# Patient Record
Sex: Female | Born: 1975 | Race: White | Hispanic: No | Marital: Single | Smoking: Former smoker
Health system: Southern US, Community
[De-identification: ages and names within clinical notes are randomized; demographics above are authoritative.]

## PROBLEM LIST (undated history)

## (undated) DIAGNOSIS — A6000 Herpesviral infection of urogenital system, unspecified: Secondary | ICD-10-CM

## (undated) DIAGNOSIS — K589 Irritable bowel syndrome without diarrhea: Secondary | ICD-10-CM

## (undated) DIAGNOSIS — B360 Pityriasis versicolor: Secondary | ICD-10-CM

## (undated) HISTORY — DX: Herpesviral infection of urogenital system, unspecified: A60.00

## (undated) HISTORY — DX: Irritable bowel syndrome, unspecified: K58.9

## (undated) HISTORY — DX: Pityriasis versicolor: B36.0

## (undated) HISTORY — PX: WISDOM TOOTH EXTRACTION: SHX21

---

## 2017-10-14 ENCOUNTER — Other Ambulatory Visit: Payer: Self-pay | Admitting: Certified Nurse Midwife

## 2018-08-23 ENCOUNTER — Telehealth: Payer: Self-pay

## 2018-08-23 NOTE — Telephone Encounter (Signed)
Pt called after hour nurse for refills of valtrex which was given by PH.

## 2018-09-28 ENCOUNTER — Other Ambulatory Visit: Payer: Self-pay | Admitting: Certified Nurse Midwife

## 2018-09-28 ENCOUNTER — Telehealth: Payer: Self-pay

## 2018-09-28 MED ORDER — VALACYCLOVIR HCL 1 G PO TABS: 500.0000 mg | ORAL_TABLET | Freq: Every day | ORAL | 0 refills | Status: DC

## 2018-09-28 NOTE — Telephone Encounter (Signed)
Pt has appt scheduled for 10/29/2018.  Needs refill of to get her to appt.  [phone]  Called pt to verify what medication and what pharm.  Pt states valtrex 1000mg  (for rx to last longer than rx of 500mg  would)  She takes half a pill at a time.

## 2018-09-28 NOTE — Telephone Encounter (Signed)
Done. Karen Wheeler, CNM

## 2018-09-29 NOTE — Telephone Encounter (Signed)
Pt calling to see if refill was sent in.  [phone] Adv pt eRx'd

## 2018-10-29 ENCOUNTER — Ambulatory Visit (INDEPENDENT_AMBULATORY_CARE_PROVIDER_SITE_OTHER): Payer: 59 | Admitting: Certified Nurse Midwife

## 2018-10-29 ENCOUNTER — Encounter: Payer: Self-pay | Admitting: Certified Nurse Midwife

## 2018-10-29 ENCOUNTER — Other Ambulatory Visit (HOSPITAL_COMMUNITY)
Admission: RE | Admit: 2018-10-29 | Discharge: 2018-10-29 | Disposition: A | Discharge status: Home / Self Care | Payer: 59 | Source: Ambulatory Visit | Attending: Certified Nurse Midwife | Admitting: Certified Nurse Midwife

## 2018-10-29 VITALS — BP 104/70 | HR 87 | Ht 66.0 in | Wt 134.0 lb

## 2018-10-29 DIAGNOSIS — Z13220 Encounter for screening for lipoid disorders: Secondary | ICD-10-CM

## 2018-10-29 DIAGNOSIS — Z01419 Encounter for gynecological examination (general) (routine) without abnormal findings: Secondary | ICD-10-CM

## 2018-10-29 DIAGNOSIS — Z131 Encounter for screening for diabetes mellitus: Secondary | ICD-10-CM

## 2018-10-29 DIAGNOSIS — Z23 Encounter for immunization: Secondary | ICD-10-CM

## 2018-10-29 DIAGNOSIS — Z124 Encounter for screening for malignant neoplasm of cervix: Secondary | ICD-10-CM | POA: Insufficient documentation

## 2018-10-29 DIAGNOSIS — A6000 Herpesviral infection of urogenital system, unspecified: Secondary | ICD-10-CM | POA: Insufficient documentation

## 2018-10-29 DIAGNOSIS — Z1239 Encounter for other screening for malignant neoplasm of breast: Secondary | ICD-10-CM

## 2018-10-29 MED ORDER — TRIAMCINOLONE ACETONIDE 0.1 % EX OINT: 1.0000 "application " | TOPICAL_OINTMENT | Freq: Two times a day (BID) | CUTANEOUS | 1 refills | Status: DC | PRN

## 2018-10-29 MED ORDER — VALACYCLOVIR HCL 1 G PO TABS: 500.0000 mg | ORAL_TABLET | Freq: Every day | ORAL | 11 refills | Status: DC

## 2018-10-29 NOTE — Progress Notes (Signed)
Gynecology Annual Exam  PCP: Patient, No Pcp Per  Chief Complaint:  Chief Complaint  Patient presents with  . Gynecologic Exam    History of Present Illness:Jaxon Papalia is a 43 year old Caucasian/White female, G1 P0010, who presents for her annual exam. She is having no significant GYN problems.  Her menses are regular . They occur every 1 month , they last 3-5days , are medium flow with one heavier day requiring pad change q2-3 hours and are without clots. Her LMP was 10/16/2018 She has had no intermenstrual spotting.   She reports dysmenorrhea with some menses. She uses ibuprofen with relief.  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 She is requesting a refill.  Since her last annual GYN exam dated 09/18/2016 , she has had no significant changes in her health history.  She is sexually active. She is currently using condoms for contraception.  Her most recent pap smear was obtained 09/18/2016 and was NIL/ neg HRHPV. A 2016 Pap was LGSIL with negative HRHPV. Mammogram is not applicable.  There is no family history of breast cancer.  There is no family history of ovarian cancer.  The patient does do monthly self breast exams.  The patient quit smoking 4-5 months ago.  The patient does drink about 4/ month The patient does not use illegal drugs.  The patient exercises regularly.  The patient does get adequate calcium in her diet.  She has not had a recent lipid panel and is interested.  Review of Systems: Review of Systems  Constitutional: Negative for chills, fever and weight loss.  HENT: Negative for congestion, sinus pain and sore throat.   Eyes: Negative for blurred vision and pain.  Respiratory: Negative for hemoptysis, shortness of breath and wheezing.   Cardiovascular: Negative for chest pain, palpitations and leg swelling.  Gastrointestinal: Negative for abdominal pain,  blood in stool, diarrhea, heartburn, nausea and vomiting.  Genitourinary: Negative for dysuria, frequency, hematuria and urgency.  Musculoskeletal: Negative for back pain, joint pain and myalgias.  Skin: Negative for itching and rash.  Neurological: Negative for dizziness, tingling and headaches.  Endo/Heme/Allergies: Negative for environmental allergies and polydipsia. Does not bruise/bleed easily.       Negative for hirsutism   Psychiatric/Behavioral: Negative for depression. The patient is not nervous/anxious and does not have insomnia.     Past Medical History:  Past Medical History:  Diagnosis Date  . Herpes genitalis   . Irritable bowel syndrome (IBS)   . Tinea versicolor     Past Surgical History:  Past Surgical History:  Procedure Laterality Date  . WISDOM TOOTH EXTRACTION      Family History:  Family History  Problem Relation Age of Onset  . Hypertension Father   . Other Sister        cardiac anomaly, congenital  . Stroke Sister 79       had valve replacement  . Kidney cancer Maternal Grandmother        family history  . Diabetes Other        type 2    Social History:  Social History   Socioeconomic History  . Marital status: Single    Spouse name: Not on file  . Number of children: 0  . Years of education: Not on file  . Highest education level: Not on file  Occupational History  . Occupation: Scientific laboratory technician  Social Needs  . Financial resource strain: Not on file  . Food insecurity:    Worry: Not on file    Inability: Not on file  . Transportation needs:    Medical: Not on file    Non-medical: Not on file  Tobacco Use  . Smoking status: Former Smoker    Packs/day: 0.50    Types: Cigarettes  . Smokeless tobacco: Former Neurosurgeon    Quit date: 06/14/2018  Substance and Sexual Activity  . Alcohol use: Yes    Comment: 4/ month  . Drug use: Never  . Sexual activity: Yes    Partners: Male    Birth control/protection: Condom  Lifestyle  .  Physical activity:    Days per week: Not on file    Minutes per session: Not on file  . Stress: Not on file  Relationships  . Social connections:    Talks on phone: Not on file    Gets together: Not on file    Attends religious service: Not on file    Active member of club or organization: Not on file    Attends meetings of clubs or organizations: Not on file    Relationship status: Not on file  . Intimate partner violence:    Fear of current or ex partner: Not on file    Emotionally abused: Not on file    Physically abused: Not on file    Forced sexual activity: Not on file  Other Topics Concern  . Not on file  Social History Narrative  . Not on file    Allergies:  No Known Allergies  Medications: Prior to Admission medications   Medication Sig Start Date End Date Taking? Authorizing Provider  diphenhydrAMINE (BENADRYL) 25 MG tablet Take by mouth.   Yes [provider]  loratadine (CLARITIN) 10 MG tablet Take by mouth.   Yes [provider]  valACYclovir (VALTREX) 1000 MG tablet Take 0.5 tablets (500 mg total) by mouth daily. 09/28/18  Yes Farrel Conners, CNM    Physical Exam Vitals: Blood pressure 104/70, pulse 87, height 5\' 6"  (1.676 m), weight 134 lb (60.8 kg), last menstrual period 10/16/2018.  General: WF In NAD HEENT: normocephalic, anicteric Neck: no thyroid enlargement, no palpable nodules, no cervical lymphadenopathy  Pulmonary: No increased work of breathing, CTAB Cardiovascular: RRR, without murmur  Breast: Breast symmetrical, no tenderness, no palpable nodules or masses, no skin or nipple retraction present, no nipple discharge.  No axillary, infraclavicular or supraclavicular lymphadenopathy. Abdomen: Soft, non-tender, non-distended.  Umbilicus without lesions.  No hepatomegaly or masses palpable. No evidence of hernia. Genitourinary:  External: Normal external female genitalia.  Normal urethral meatus, normal  Bartholin's and Skene's  glands.    Vagina: Normal vaginal mucosa, no evidence of prolapse.    Cervix: Grossly normal in appearance, no bleeding, non-tender  Uterus: Anteverted, normal size, shape, and consistency, mobile, and non-tender  Adnexa: No adnexal masses, non-tender  Rectal: deferred  Lymphatic: no evidence of inguinal lymphadenopathy Extremities: no edema, erythema, or tenderness Neurologic: Grossly intact Psychiatric: mood appropriate, affect full     Assessment: 43 y.o. G1P0010 with normal gyn exam Herpes genitalis   Plan:   1) Breast cancer screening - recommend monthly self breast exam and annual screening mammograms. Mammogram was ordered today. Patient to schedule at [facility]  2) STI screening was offered and declined.  3) Cervical cancer screening - Pap was done. ASCCP guidelines and rational discussed.  Patient opts for every 1-2 years screening interval  4) Contraception - condoms.   5) Routine healthcare maintenance including cholesterol and diabetes screening ordered today  Last TDAP was 11/22/2008. Given TDAP today.   6)Refill Valtrex 10000 mgm-take 1/2 tablet daily. Also RX for Kenalog ointment given for prn use for itching.   7)RTO 1 year and prn.  Farrel Connersolleen Deyonna Fitzsimmons, CNM

## 2018-10-30 ENCOUNTER — Encounter: Payer: Self-pay | Admitting: Certified Nurse Midwife

## 2018-10-30 LAB — LIPID PANEL WITH LDL/HDL RATIO
Cholesterol, Total: 183 mg/dL (ref 100–199)
HDL: 74 mg/dL (ref >39)
LDL Calculated: 92 mg/dL (ref 0–99)
LDl/HDL Ratio: 1.2 ratio (ref 0.0–3.2)
TRIGLYCERIDES: 85 mg/dL (ref 0–149)
VLDL CHOLESTEROL CAL: 17 mg/dL (ref 5–40)

## 2018-10-30 LAB — HEMOGLOBIN A1C
Est. average glucose Bld gHb Est-mCnc: 105 mg/dL
Hgb A1c MFr Bld: 5.3 % (ref 4.8–5.6)

## 2018-11-04 LAB — CYTOLOGY - PAP: HPV: DETECTED — AB

## 2018-11-05 ENCOUNTER — Encounter: Payer: Self-pay | Admitting: Certified Nurse Midwife

## 2018-11-05 DIAGNOSIS — R87612 Low grade squamous intraepithelial lesion on cytologic smear of cervix (LGSIL): Secondary | ICD-10-CM | POA: Insufficient documentation

## 2018-11-22 ENCOUNTER — Ambulatory Visit: Payer: 59 | Admitting: Obstetrics and Gynecology

## 2018-11-22 ENCOUNTER — Encounter: Payer: Self-pay | Admitting: Obstetrics and Gynecology

## 2018-11-22 ENCOUNTER — Other Ambulatory Visit (HOSPITAL_COMMUNITY)
Admission: RE | Admit: 2018-11-22 | Discharge: 2018-11-22 | Disposition: A | Discharge status: Home / Self Care | Payer: 59 | Source: Ambulatory Visit | Attending: Obstetrics and Gynecology | Admitting: Obstetrics and Gynecology

## 2018-11-22 VITALS — HR 81 | Ht 65.5 in | Wt 137.0 lb

## 2018-11-22 DIAGNOSIS — R87612 Low grade squamous intraepithelial lesion on cytologic smear of cervix (LGSIL): Secondary | ICD-10-CM | POA: Insufficient documentation

## 2018-11-22 NOTE — Patient Instructions (Signed)
COLPOSCOPY POST-PROCEDURE INSTRUCTIONS  1. You may take Ibuprofen, Aleve or Tylenol for cramping if needed.  2. If Monsel's solution was used, you will have a black discharge.  3. Light bleeding is normal.  If bleeding is heavier than your period, please call.  4. Put nothing in your vagina until the bleeding or discharge stops (usually 2 or 3 days).  5. We will call you within one week with biopsy results or discuss the results at your follow-up appointment if needed.  

## 2018-11-22 NOTE — Progress Notes (Signed)
   GYNECOLOGY CLINIC COLPOSCOPY PROCEDURE NOTE  43 y.o. G1P0010 here for colposcopy for low-grade squamous intraepithelial neoplasia (LGSIL - encompassing HPV,mild dysplasia,CIN I)  pap smear on 10/29/2018. Discussed underlying role for HPV infection in the development of cervical dysplasia, its natural history and progression/regression, need for surveillance.  Is the patient  pregnant: No LMP: Patient's last menstrual period was 11/12/2018 (exact date). Smoking status:  reports that she has quit smoking. Her smoking use included cigarettes. She smoked 0.50 packs per day. She quit smokeless tobacco use about 5 months ago.  Patient given informed consent, signed copy in the chart, time out was performed.  The patient was position in dorsal lithotomy position. Speculum was placed the cervix was visualized.   After application of acetic acid colposcopic inspection of the cervix was undertaken.   Colposcopy adequate, full visualization of transformation zone: No punctation noted at 11 o'clock; corresponding biopsies obtained.   ECC specimen obtained:  Yes  All specimens were labeled and sent to pathology.   Patient was given post procedure instructions.  Will follow up pathology and manage accordingly.  Routine preventative health maintenance measures emphasized.  Physical Exam Genitourinary:        Adelene Idler MD [facility], [facility] 11/22/2018 5:40 PM

## 2018-11-29 NOTE — Progress Notes (Signed)
Called and discussed with patient. Repeat pap smear in 1 year.

## 2019-11-12 ENCOUNTER — Other Ambulatory Visit: Payer: Self-pay | Admitting: Certified Nurse Midwife

## 2020-01-17 ENCOUNTER — Other Ambulatory Visit: Payer: Self-pay | Admitting: Certified Nurse Midwife

## 2020-03-07 ENCOUNTER — Other Ambulatory Visit: Payer: Self-pay

## 2020-03-07 ENCOUNTER — Other Ambulatory Visit (HOSPITAL_COMMUNITY)
Admission: RE | Admit: 2020-03-07 | Discharge: 2020-03-07 | Disposition: A | Discharge status: Home / Self Care | Payer: 59 | Source: Ambulatory Visit | Attending: Certified Nurse Midwife | Admitting: Certified Nurse Midwife

## 2020-03-07 ENCOUNTER — Encounter: Payer: Self-pay | Admitting: Certified Nurse Midwife

## 2020-03-07 ENCOUNTER — Ambulatory Visit (INDEPENDENT_AMBULATORY_CARE_PROVIDER_SITE_OTHER): Payer: 59 | Admitting: Certified Nurse Midwife

## 2020-03-07 VITALS — BP 120/70 | HR 99 | Resp 16 | Ht 65.0 in | Wt 140.2 lb

## 2020-03-07 DIAGNOSIS — N870 Mild cervical dysplasia: Secondary | ICD-10-CM | POA: Diagnosis not present

## 2020-03-07 DIAGNOSIS — R87612 Low grade squamous intraepithelial lesion on cytologic smear of cervix (LGSIL): Secondary | ICD-10-CM

## 2020-03-07 DIAGNOSIS — Z01419 Encounter for gynecological examination (general) (routine) without abnormal findings: Secondary | ICD-10-CM | POA: Diagnosis not present

## 2020-03-07 DIAGNOSIS — Z1231 Encounter for screening mammogram for malignant neoplasm of breast: Secondary | ICD-10-CM | POA: Diagnosis not present

## 2020-03-07 MED ORDER — VALACYCLOVIR HCL 1 G PO TABS: ORAL_TABLET | ORAL | 3 refills | Status: DC

## 2020-03-07 NOTE — Progress Notes (Signed)
Gynecology Annual Exam  PCP: Patient, No Pcp Per  Chief Complaint:  Chief Complaint  Patient presents with  . Gynecologic Exam    History of Present Illness:Karen Wheeler is a 44 year old Caucasian/White female, G1 P0010, who presents for her annual exam. She is having no significant GYN problems.  Her menses are regular . They occur every 1 month , they last 3 days , are medium flow with one heavier day requiring pad change q2-3 hours and are without clots. Her LMP was 03/01/2020.  She has had no intermenstrual spotting.   She reports dysmenorrhea on the first day of her menses.  She uses ibuprofen with relief.  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 She is requesting a refill.  Since her last annual GYN exam dated 10/29/2018 , she has had no significant changes in her health history. She did not get the Covid vaccine. Her MGM died last 2023-08-01 from COPD/ ? Lung cancer. She is taking care of her step MGF who has Parkinson's She is sexually active. She is currently using condoms for contraception.  Her most recent pap smear was obtained 10/29/2018 and was LGSIL/ positive HRHPV. 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 Has not had a mammogram yet.  There is no family history of breast cancer.  There is no family history of ovarian cancer.  The patient does do monthly self breast exams.  The patient quit smoking in 2019.  The patient does drink occasionally The patient does not use illegal drugs.  The patient exercises by walking 4 miles twice a week.  The patient does get adequate calcium in her diet.  She had a lipid panel done 2020 which was normal.   Review of Systems: Review of Systems  Constitutional: Negative for chills, fever and weight loss.  HENT: Negative for congestion, sinus pain and sore throat.   Eyes: Negative for blurred vision and pain.    Respiratory: Negative for hemoptysis, shortness of breath and wheezing.   Cardiovascular: Negative for chest pain, palpitations and leg swelling.  Gastrointestinal: Negative for abdominal pain, blood in stool, diarrhea, heartburn, nausea and vomiting.  Genitourinary: Negative for dysuria, frequency, hematuria and urgency.  Musculoskeletal: Negative for back pain, joint pain and myalgias.  Skin: Negative for itching and rash.  Neurological: Negative for dizziness, tingling and headaches.  Endo/Heme/Allergies: Positive for environmental allergies. Negative for polydipsia. Does not bruise/bleed easily.       Negative for hirsutism   Psychiatric/Behavioral: Negative for depression. The patient is nervous/anxious. The patient does not have insomnia.     Past Medical History:  Past Medical History:  Diagnosis Date  . Herpes genitalis   . Irritable bowel syndrome (IBS)   . Tinea versicolor     Past Surgical History:  Past Surgical History:  Procedure Laterality Date  . WISDOM TOOTH EXTRACTION      Family History:  Family History  Problem Relation Age of Onset  . Hypertension Father   . Other Sister        cardiac anomaly, congenital  . Stroke Sister 60       had valve replacement  . Kidney cancer Maternal Grandmother        ?lung cancer  . COPD Maternal Grandmother   . Diabetes Other        type 2    Social History:  Social History   Socioeconomic History  . Marital status: Single    Spouse name: Not on file  . Number of children: 0  . Years of education: Not on file  . Highest education level: Not on file  Occupational History  . Occupation: Scientific laboratory technician  Tobacco Use  . Smoking status: Former Smoker    Packs/day: 0.50    Types: Cigarettes  . Smokeless tobacco: Former Neurosurgeon    Quit date: 06/14/2018  Vaping Use  . Vaping Use: Never used  Substance and Sexual Activity  . Alcohol use: Yes    Comment: 4/ month  . Drug use: Never  . Sexual activity: Yes     Partners: Male    Birth control/protection: Condom  Other Topics Concern  . Not on file  Social History Narrative  . Not on file   Social Determinants of Health   Financial Resource Strain:   . Difficulty of Paying Living Expenses:   Food Insecurity:   . Worried About Programme researcher, broadcasting/film/video in the Last Year:   . Barista in the Last Year:   Transportation Needs:   . Freight forwarder (Medical):   Marland Kitchen Lack of Transportation (Non-Medical):   Physical Activity:   . Days of Exercise per Week:   . Minutes of Exercise per Session:   Stress:   . Feeling of Stress :   Social Connections:   . Frequency of Communication with Friends and Family:   . Frequency of Social Gatherings with Friends and Family:   . Attends Religious Services:   . Active Member of Clubs or Organizations:   . Attends Banker Meetings:   Marland Kitchen Marital Status:   Intimate Partner Violence:   . Fear of Current or Ex-Partner:   . Emotionally Abused:   Marland Kitchen Physically Abused:   . Sexually Abused:     Allergies:  No Known Allergies  Medications:  Current Outpatient Medications:  .  diphenhydrAMINE (BENADRYL) 25 MG tablet, Take by mouth., Disp: , Rfl:  .  fexofenadine (ALLEGRA) 180 MG tablet, Take 180 mg by mouth daily., Disp: , Rfl:  .  triamcinolone ointment (KENALOG) 0.1 %, Apply 1 application topically 2 (two) times daily as needed., Disp: 30 g, Rfl: 1 .  valACYclovir (VALTREX) 1000 MG tablet, TAKE ONE HALF (1/2) TABLET BY MOUTH DAILY, Disp: 45 tablet, Rfl: 3   :Physical Exam Vitals: BP 120/70   Pulse 99   Resp 16   Ht 5\' 5"  (1.651 m)   Wt 140 lb 3.2 oz (63.6 kg)   LMP 03/01/2020   SpO2 99%   BMI 23.33 kg/m   General: WF In NAD HEENT: normocephalic, anicteric Neck: no thyroid enlargement, no palpable nodules, no cervical lymphadenopathy  Pulmonary: No increased work of breathing, CTAB Cardiovascular: RRR, without murmur  Breast: Breast symmetrical, no tenderness, no palpable  nodules or masses, no skin or nipple retraction present, no nipple discharge.  No axillary, infraclavicular or supraclavicular lymphadenopathy. Abdomen: Soft, non-tender, non-distended.  Umbilicus without lesions.  No hepatomegaly or masses palpable. No evidence of hernia. Genitourinary:  External: Normal external female genitalia.  Normal urethral meatus, normal Bartholin's and Skene's glands.    Vagina: Normal vaginal mucosa, no evidence of prolapse.    Cervix: Grossly normal in appearance, no bleeding, non-tender  Uterus: Anteverted, normal size, shape, and consistency, mobile, and non-tender  Adnexa: No adnexal masses, non-tender  Rectal: deferred  Lymphatic: no evidence of inguinal lymphadenopathy Extremities: no edema, erythema,  or tenderness Neurologic: Grossly intact Psychiatric: mood appropriate, affect full     Assessment: 44 y.o. 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 Mammogram was ordered today. Patient to schedule at [facility]  2) Cervical cancer screening/ CIN1 last year - Pap was done.  3) Contraception - condoms.   4) Routine healthcare maintenance including cholesterol and diabetes screening UTD .   5)Refill Valtrex 10000 mgm-take 1/2 tablet daily.   6)RTO 1 year and prn.  Farrel Conners, CNM

## 2020-03-12 LAB — CYTOLOGY - PAP
Comment: NEGATIVE
Diagnosis: UNDETERMINED — AB
High risk HPV: POSITIVE — AB

## 2020-04-05 ENCOUNTER — Telehealth: Payer: Self-pay | Admitting: Certified Nurse Midwife

## 2020-04-05 NOTE — Telephone Encounter (Signed)
Called with pap smear results: ASCUS with positive HRHPV. Previous colposcopy for LGSIL PAP last year. Colpo biopsy showed CIN1. Recommend another colposcopy per ASCCP guidelines. Scheduled for colposcopy with Dr Jerene Pitch. Farrel Conners, CNM

## 2020-05-02 ENCOUNTER — Ambulatory Visit: Payer: 59 | Admitting: Obstetrics and Gynecology

## 2020-05-29 ENCOUNTER — Encounter: Payer: Self-pay | Admitting: Obstetrics and Gynecology

## 2020-05-29 ENCOUNTER — Other Ambulatory Visit: Payer: Self-pay

## 2020-05-29 ENCOUNTER — Ambulatory Visit (INDEPENDENT_AMBULATORY_CARE_PROVIDER_SITE_OTHER): Payer: 59 | Admitting: Obstetrics and Gynecology

## 2020-05-29 ENCOUNTER — Other Ambulatory Visit (HOSPITAL_COMMUNITY)
Admission: RE | Admit: 2020-05-29 | Discharge: 2020-05-29 | Disposition: A | Discharge status: Home / Self Care | Payer: 59 | Source: Ambulatory Visit | Attending: Obstetrics and Gynecology | Admitting: Obstetrics and Gynecology

## 2020-05-29 VITALS — BP 100/70 | HR 71 | Resp 18 | Ht 65.0 in | Wt 142.0 lb

## 2020-05-29 DIAGNOSIS — R87610 Atypical squamous cells of undetermined significance on cytologic smear of cervix (ASC-US): Secondary | ICD-10-CM | POA: Diagnosis present

## 2020-05-29 NOTE — Progress Notes (Signed)
   GYNECOLOGY CLINIC COLPOSCOPY PROCEDURE NOTE  44 y.o. G1P0010 here for colposcopy for ASCUS with NEGATIVE high risk HPV  pap smear on 03/07/2020. Hx of CIN on colposcopy in 2021.  Discussed underlying role for HPV infection in the development of cervical dysplasia, its natural history and progression/regression, need for surveillance.  Is the patient  pregnant: No LMP: Patient's last menstrual period was 05/21/2020. Smoking status:  reports that she has quit smoking. Her smoking use included cigarettes. She smoked 0.50 packs per day. She quit smokeless tobacco use about 1 years ago.  Patient given informed consent, signed copy in the chart, time out was performed.  The patient was position in dorsal lithotomy position. Speculum was placed the cervix was visualized.   After application of acetic acid colposcopic inspection of the cervix was undertaken.   Colposcopy adequate, full visualization of transformation zone: Yes no visible lesions; corresponding biopsies obtained.   ECC specimen obtained:  Yes  All specimens were labeled and sent to pathology.   Patient was given post procedure instructions.  Will follow up pathology and manage accordingly.  Routine preventative health maintenance measures emphasized.  Physical Exam Genitourinary:     Exam conducted with a chaperone present.     Adelene Idler MD [facility], [facility] 05/29/2020 3:42 PM

## 2020-05-29 NOTE — Patient Instructions (Signed)
Colposcopy, Care After This sheet gives you information about how to care for yourself after your procedure. Your doctor may also give you more specific instructions. If you have problems or questions, contact your doctor. What can I expect after the procedure? If you did not have a tissue sample removed (did not have a biopsy), you may only have some spotting for a few days. You can go back to your normal activities. If you had a tissue sample removed, it is common to have:  Soreness and pain. This may last for a few days.  Light-headedness.  Mild bleeding from your vagina or dark-colored, grainy discharge from your vagina. This may last for a few days. You may need to wear a sanitary pad.  Spotting for at least 48 hours after the procedure. Follow these instructions at home:   Take over-the-counter and prescription medicines only as told by your doctor. Ask your doctor what medicines you can start taking again. This is very important if you take blood-thinning medicine.  Do not drive or use heavy machinery while taking prescription pain medicine.  For 3 days, or as long as your doctor tells you, avoid: ? Douching. ? Using tampons. ? Having sex.  If you use birth control (contraception), keep using it.  Limit activity for the first day after the procedure. Ask your doctor what activities are safe for you.  It is up to you to get the results of your procedure. Ask your doctor when your results will be ready.  Keep all follow-up visits as told by your doctor. This is important. Contact a doctor if:  You get a skin rash. Get help right away if:  You are bleeding a lot from your vagina. It is a lot of bleeding if you are using more than one pad an hour for 2 hours in a row.  You have clumps of blood (blood clots) coming from your vagina.  You have a fever.  You have chills  You have pain in your lower belly (pelvic area).  You have signs of infection, such as vaginal  discharge that is: ? Different than usual. ? Yellow. ? Bad-smelling.  You have very pain or cramps in your lower belly that do not get better with medicine.  You feel light-headed.  You feel dizzy.  You pass out (faint). Summary  If you did not have a tissue sample removed (did not have a biopsy), you may only have some spotting for a few days. You can go back to your normal activities.  If you had a tissue sample removed, it is common to have mild pain and spotting for 48 hours.  For 3 days, or as long as your doctor tells you, avoid douching, using tampons and having sex.  Get help right away if you have bleeding, very bad pain, or signs of infection. This information is not intended to replace advice given to you by your health care provider. Make sure you discuss any questions you have with your health care provider. Document Revised: 08/14/2017 Document Reviewed: 05/21/2016 Elsevier Patient Education  2020 [employer]  

## 2020-06-01 LAB — SURGICAL PATHOLOGY

## 2020-09-28 ENCOUNTER — Ambulatory Visit
Admission: EM | Admit: 2020-09-28 | Discharge: 2020-09-28 | Disposition: A | Discharge status: Home / Self Care | Payer: 59 | Attending: Family Medicine | Admitting: Family Medicine

## 2020-09-28 ENCOUNTER — Encounter: Payer: Self-pay | Admitting: Family Medicine

## 2020-09-28 ENCOUNTER — Ambulatory Visit (INDEPENDENT_AMBULATORY_CARE_PROVIDER_SITE_OTHER): Payer: 59

## 2020-09-28 DIAGNOSIS — U071 COVID-19: Secondary | ICD-10-CM

## 2020-09-28 DIAGNOSIS — J1282 Pneumonia due to coronavirus disease 2019: Secondary | ICD-10-CM | POA: Diagnosis not present

## 2020-09-28 MED ORDER — AZITHROMYCIN 250 MG PO TABS: 250.0000 mg | ORAL_TABLET | Freq: Every day | ORAL | 0 refills | Status: DC

## 2020-09-28 MED ORDER — ALBUTEROL SULFATE HFA 108 (90 BASE) MCG/ACT IN AERS: 1.0000 | INHALATION_SPRAY | Freq: Four times a day (QID) | RESPIRATORY_TRACT | 0 refills | Status: DC | PRN

## 2020-09-28 MED ORDER — ONDANSETRON 4 MG PO TBDP: 4.0000 mg | ORAL_TABLET | Freq: Three times a day (TID) | ORAL | 0 refills | Status: DC | PRN

## 2020-09-28 MED ORDER — DEXAMETHASONE SODIUM PHOSPHATE 10 MG/ML IJ SOLN: 10.0000 mg | Freq: Once | INTRAMUSCULAR | Status: DC

## 2020-09-28 NOTE — ED Provider Notes (Signed)
Karen Wheeler    CSN: [id number] Arrival date & time: 09/28/20  1001      History   Chief Complaint Chief Complaint  Patient presents with  . Covid Positive    HPI Karen Wheeler is a 45 y.o. female.   Patient is a 45 year old female who presents today for chest tightness, cough.  Tested positive for COVID approximately 1 week ago.  Concern for pneumonia.  Mild shortness of breath. Fever is coming and going. Mild nausea.      Past Medical History:  Diagnosis Date  . Herpes genitalis   . Irritable bowel syndrome (IBS)   . Tinea versicolor     Patient Active Problem List   Diagnosis Date Noted  . LGSIL on Pap smear of cervix 11/05/2018  . Genital herpes 10/29/2018    Past Surgical History:  Procedure Laterality Date  . WISDOM TOOTH EXTRACTION      OB History    Gravida  1   Para      Term      Preterm      AB  1   Living        SAB      IAB      Ectopic      Multiple      Live Births               Home Medications    Prior to Admission medications   Medication Sig Start Date End Date Taking? Authorizing Provider  albuterol (VENTOLIN HFA) 108 (90 Base) MCG/ACT inhaler Inhale 1-2 puffs into the lungs every 6 (six) hours as needed for wheezing or shortness of breath. 09/28/20  Yes Sherece Gambrill A, NP  azithromycin (ZITHROMAX) 250 MG tablet Take 1 tablet (250 mg total) by mouth daily. Take first 2 tablets together, then 1 every day until finished. 09/28/20  Yes Daffney Greenly A, NP  ondansetron (ZOFRAN ODT) 4 MG disintegrating tablet Take 1 tablet (4 mg total) by mouth every 8 (eight) hours as needed for nausea or vomiting. 09/28/20  Yes Brennyn Ortlieb A, NP  diphenhydrAMINE (BENADRYL) 25 MG tablet Take by mouth.    [provider]  fexofenadine (ALLEGRA) 180 MG tablet Take 180 mg by mouth daily.    [provider]  triamcinolone ointment (KENALOG) 0.1 % Apply 1 application topically 2 (two) times daily as needed. 10/29/18    Farrel Conners, CNM  valACYclovir (VALTREX) 1000 MG tablet TAKE ONE HALF (1/2) TABLET BY MOUTH DAILY 03/07/20   Farrel Conners, CNM    Family History Family History  Problem Relation Age of Onset  . Hypertension Father   . Other Sister        cardiac anomaly, congenital  . Stroke Sister 55       had valve replacement  . Kidney cancer Maternal Grandmother        ?lung cancer  . COPD Maternal Grandmother   . Diabetes Other        type 2    Social History Social History   Tobacco Use  . Smoking status: Former Smoker    Packs/day: 0.50    Types: Cigarettes  . Smokeless tobacco: Former Neurosurgeon    Quit date: 06/14/2018  Vaping Use  . Vaping Use: Never used  Substance Use Topics  . Alcohol use: Yes    Comment: 4/ month  . Drug use: Never     Allergies   Patient has no known allergies.   Review of  Systems Review of Systems   Physical Exam Triage Vital Signs ED Triage Vitals  Enc Vitals Group     BP 09/28/20 1045 122/83     Pulse Rate 09/28/20 1045 95     Resp 09/28/20 1045 17     Temp 09/28/20 1045 98.9 F (37.2 C)     Temp Source 09/28/20 1045 Oral     SpO2 09/28/20 1045 96 %     Weight --      Height --      Head Circumference --      Peak Flow --      Pain Score 09/28/20 1054 0     Pain Loc --      Pain Edu? --      Excl. in GC? --    No data found.  Updated Vital Signs BP 122/83 (BP Location: Left Arm)   Pulse 95   Temp 98.9 F (37.2 C) (Oral)   Resp 17   LMP 09/08/2020   SpO2 96%   Visual Acuity Right Eye Distance:   Left Eye Distance:   Bilateral Distance:    Right Eye Near:   Left Eye Near:    Bilateral Near:     Physical Exam Vitals and nursing note reviewed.  Constitutional:      General: She is not in acute distress.    Appearance: Normal appearance. She is not ill-appearing, toxic-appearing or diaphoretic.  HENT:     Head: Normocephalic.     Right Ear: Tympanic membrane and ear canal normal.     Left Ear: Tympanic  membrane and ear canal normal.     Nose: Nose normal.     Mouth/Throat:     Pharynx: Oropharynx is clear.  Eyes:     Conjunctiva/sclera: Conjunctivae normal.  Cardiovascular:     Rate and Rhythm: Normal rate and regular rhythm.  Pulmonary:     Effort: Pulmonary effort is normal.     Breath sounds: Normal breath sounds.  Musculoskeletal:        General: Normal range of motion.     Cervical back: Normal range of motion.  Skin:    General: Skin is warm and dry.     Findings: No rash.  Neurological:     Mental Status: She is alert.  Psychiatric:        Mood and Affect: Mood normal.      UC Treatments / Results  Labs (all labs ordered are listed, but only abnormal results are displayed) Labs Reviewed - No data to display  EKG   Radiology DG Chest 2 View  Result Date: 09/28/2020 CLINICAL DATA:  Chest tightness.  Coronavirus infection. EXAM: CHEST - 2 VIEW COMPARISON:  None. FINDINGS: Heart size is normal. Mediastinal shadows are normal. Question few small areas of patchy infiltrate in the mid and lower lungs, not conclusively related to acute pneumonia. These could be areas of scarring. No visible effusion. No significant bone finding. IMPRESSION: Question small areas of patchy infiltrate in the mid and lower lungs which could be areas of scarring or mild pneumonia. No advanced pulmonary pathology visible by radiography. Electronically Signed   By: Paulina Fusi M.D.   On: 09/28/2020 11:09    Procedures Procedures (including critical care time)  Medications Ordered in UC Medications - No data to display  Initial Impression / Assessment and Plan / UC Course  I have reviewed the triage vital signs and the nursing notes.  Pertinent labs & imaging results that were  available during my care of the patient were reviewed by me and considered in my medical decision making (see chart for details).     Covid pneumonia-this is most likely diagnosis based on symptoms, COVID history  and chest x-ray.  Chest x-ray with question small areas of patchy infiltrate in the mid and lower lungs which could be areas of scarring or mild pneumonia Most likely viral pneumonia but to be safe we will go ahead and cover with antibiotics at this time in case this bacterial.  Treating with azithromycin. Prescribed albuterol inhaler to use for cough, wheezing or shortness of breath.  Zofran for nausea, vomiting as needed Recommended monitor oxygen levels. For worsening problems or shortness of breath she will need to go to the ER. Follow up as needed for continued or worsening symptoms    Final Clinical Impressions(s) / UC Diagnoses   Final diagnoses:  Pneumonia due to COVID-19 virus     Discharge Instructions     You have what is most likely Covid due to pneumonia Inhaler as needed for cough, wheezing SOB.  As we spoke about the antibiotics may not help but we can try  Monitor your oxygen. If you worsen please go to the ER.     ED Prescriptions    Medication Sig Dispense Auth. Provider   albuterol (VENTOLIN HFA) 108 (90 Base) MCG/ACT inhaler Inhale 1-2 puffs into the lungs every 6 (six) hours as needed for wheezing or shortness of breath. 1 each Kahdijah Errickson A, NP   ondansetron (ZOFRAN ODT) 4 MG disintegrating tablet Take 1 tablet (4 mg total) by mouth every 8 (eight) hours as needed for nausea or vomiting. 20 tablet Tasheem Elms A, NP   azithromycin (ZITHROMAX) 250 MG tablet Take 1 tablet (250 mg total) by mouth daily. Take first 2 tablets together, then 1 every day until finished. 6 tablet Dahlia Byes A, NP     PDMP not reviewed this encounter.   Janace Aris, NP 09/28/20 1528

## 2020-09-28 NOTE — Discharge Instructions (Addendum)
You have what is most likely Covid due to pneumonia Inhaler as needed for cough, wheezing SOB.  As we spoke about the antibiotics may not help but we can try  Monitor your oxygen. If you worsen please go to the ER.

## 2020-09-28 NOTE — ED Triage Notes (Signed)
Pt presents with c/o chest tightness after testing positive on Friday. Concerned she has pneumonia

## 2020-11-28 ENCOUNTER — Emergency Department
Admission: EM | Admit: 2020-11-28 | Discharge: 2020-11-28 | Disposition: A | Discharge status: Home / Self Care | Payer: 59 | Attending: Emergency Medicine | Admitting: Emergency Medicine

## 2020-11-28 ENCOUNTER — Other Ambulatory Visit: Payer: Self-pay

## 2020-11-28 ENCOUNTER — Emergency Department: Payer: 59

## 2020-11-28 DIAGNOSIS — Z8616 Personal history of COVID-19: Secondary | ICD-10-CM | POA: Diagnosis not present

## 2020-11-28 DIAGNOSIS — Z87891 Personal history of nicotine dependence: Secondary | ICD-10-CM | POA: Diagnosis not present

## 2020-11-28 DIAGNOSIS — R0789 Other chest pain: Secondary | ICD-10-CM | POA: Diagnosis present

## 2020-11-28 LAB — CBC
HCT: 41 % (ref 36.0–46.0)
Hemoglobin: 13.9 g/dL (ref 12.0–15.0)
MCH: 29.6 pg (ref 26.0–34.0)
MCHC: 33.9 g/dL (ref 30.0–36.0)
MCV: 87.4 fL (ref 80.0–100.0)
Platelets: 254 10*3/uL (ref 150–400)
RBC: 4.69 MIL/uL (ref 3.87–5.11)
RDW: 13.8 % (ref 11.5–15.5)
WBC: 9.4 10*3/uL (ref 4.0–10.5)
nRBC: 0 % (ref 0.0–0.2)

## 2020-11-28 LAB — BASIC METABOLIC PANEL
Anion gap: 9 (ref 5–15)
BUN: 10 mg/dL (ref 6–20)
CO2: 26 mmol/L (ref 22–32)
Calcium: 9.7 mg/dL (ref 8.9–10.3)
Chloride: 104 mmol/L (ref 98–111)
Creatinine, Ser: 0.83 mg/dL (ref 0.44–1.00)
GFR, Estimated: >60 mL/min (ref >60)
Glucose, Bld: 101 mg/dL — ABNORMAL HIGH (ref 70–99)
Potassium: 3.9 mmol/L (ref 3.5–5.1)
Sodium: 139 mmol/L (ref 135–145)

## 2020-11-28 LAB — TROPONIN I (HIGH SENSITIVITY)
Troponin I (High Sensitivity): <2 ng/L (ref <18)
Troponin I (High Sensitivity): <2 ng/L (ref <18)

## 2020-11-28 MED ORDER — LIDOCAINE VISCOUS HCL 2 % MT SOLN
15.0000 mL | Freq: Once | OROMUCOSAL | Status: DC
  Filled 2020-11-28: qty 15

## 2020-11-28 MED ORDER — ALUM & MAG HYDROXIDE-SIMETH 200-200-20 MG/5ML PO SUSP
30.0000 mL | Freq: Once | ORAL | Status: AC
  Administered 2020-11-28: 30 mL via ORAL
  Filled 2020-11-28: qty 30

## 2020-11-28 NOTE — Discharge Instructions (Addendum)
For now, I recommend a trial of an over-the-counter antacid to see if it helps with possible stress gastritis.  - Take any of the following ONCE DAILY in the morning, 20-30 min before eating -- Omeprazole -- Lansoprazole -- Esomeprazole  These are called proton-pump inhibitors ("PPIs") and can be purchased over the counter

## 2020-11-28 NOTE — ED Triage Notes (Signed)
Pt states that lately she has been having chest pain and the sensation that that her heart is beat irregularly. [pt reports that she has been under a lot of stress and states that her sister had a heart murmur and passed away 4 years ago pt states she has discomfort in her left chest left breast and left arm

## 2020-11-28 NOTE — ED Provider Notes (Signed)
[facility] Emergency Department Provider Note  ____________________________________________   Event Date/Time   First MD Initiated Contact with Patient 11/28/20 1515     (approximate)  I have reviewed the triage vital signs and the nursing notes.   HISTORY  Chief Complaint Chest Pain    HPI Karen Wheeler is a 45 y.o. female  With h/o IBS here with intermittent chest pain. Pt reports that for the past few weeks, she has had episodes of intermittent dull, pressure like chest pain that radiates toward her left shoulder, occasionally right shoulder. It seems to be intermittent and not necessarily associated w/ exertion. No relation w/ eating. No accompanying n/v/d, SOB, diaphoresis. No specific alleviating factors. No aggravating factors. She does note she has been having significant recent stress 2/2 managing her grandfather's affairs and medical care, who is now on Hospice. She also notes some intermittent palpitations x months, but has not seen anyone for this. No h/o DVT/PE. She did have COVID months ago but has gotten better.        Past Medical History:  Diagnosis Date  . Herpes genitalis   . Irritable bowel syndrome (IBS)   . Tinea versicolor     Patient Active Problem List   Diagnosis Date Noted  . LGSIL on Pap smear of cervix 11/05/2018  . Genital herpes 10/29/2018    Past Surgical History:  Procedure Laterality Date  . WISDOM TOOTH EXTRACTION      Prior to Admission medications   Medication Sig Start Date End Date Taking? Authorizing Provider  albuterol (VENTOLIN HFA) 108 (90 Base) MCG/ACT inhaler Inhale 1-2 puffs into the lungs every 6 (six) hours as needed for wheezing or shortness of breath. 09/28/20   Dahlia Byes A, NP  azithromycin (ZITHROMAX) 250 MG tablet Take 1 tablet (250 mg total) by mouth daily. Take first 2 tablets together, then 1 every day until finished. 09/28/20   Dahlia Byes A, NP  diphenhydrAMINE (BENADRYL) 25 MG  tablet Take by mouth.    [provider]  fexofenadine (ALLEGRA) 180 MG tablet Take 180 mg by mouth daily.    [provider]  ondansetron (ZOFRAN ODT) 4 MG disintegrating tablet Take 1 tablet (4 mg total) by mouth every 8 (eight) hours as needed for nausea or vomiting. 09/28/20   Dahlia Byes A, NP  triamcinolone ointment (KENALOG) 0.1 % Apply 1 application topically 2 (two) times daily as needed. 10/29/18   Farrel Conners, CNM  valACYclovir (VALTREX) 1000 MG tablet TAKE ONE HALF (1/2) TABLET BY MOUTH DAILY 03/07/20   Farrel Conners, CNM    Allergies Patient has no known allergies.  Family History  Problem Relation Age of Onset  . Hypertension Father   . Other Sister        cardiac anomaly, congenital  . Stroke Sister 25       had valve replacement  . Kidney cancer Maternal Grandmother        ?lung cancer  . COPD Maternal Grandmother   . Diabetes Other        type 2    Social History Social History   Tobacco Use  . Smoking status: Former Smoker    Packs/day: 0.50    Types: Cigarettes  . Smokeless tobacco: Former Neurosurgeon    Quit date: 06/14/2018  Vaping Use  . Vaping Use: Never used  Substance Use Topics  . Alcohol use: Yes    Comment: 4/ month  . Drug use: Never    Review  of Systems  Review of Systems  Constitutional: Positive for fatigue. Negative for fever.  HENT: Negative for congestion and sore throat.   Eyes: Negative for visual disturbance.  Respiratory: Positive for chest tightness. Negative for cough and shortness of breath.   Cardiovascular: Negative for chest pain.  Gastrointestinal: Negative for abdominal pain, diarrhea, nausea and vomiting.  Genitourinary: Negative for flank pain.  Musculoskeletal: Negative for back pain and neck pain.  Skin: Negative for rash and wound.  Neurological: Negative for weakness.  All other systems reviewed and are negative.    ____________________________________________  PHYSICAL EXAM:      VITAL  SIGNS: ED Triage Vitals  Enc Vitals Group     BP 11/28/20 1344 123/72     Pulse Rate 11/28/20 1344 84     Resp 11/28/20 1344 16     Temp 11/28/20 1344 98.3 F (36.8 C)     Temp Source 11/28/20 1344 Oral     SpO2 11/28/20 1344 100 %     Weight 11/28/20 1342 140 lb (63.5 kg)     Height 11/28/20 1342 5\' 5"  (1.651 m)     Head Circumference --      Peak Flow --      Pain Score 11/28/20 1341 5     Pain Loc --      Pain Edu? --      Excl. in GC? --      Physical Exam Vitals and nursing note reviewed.  Constitutional:      General: She is not in acute distress.    Appearance: She is well-developed.  HENT:     Head: Normocephalic and atraumatic.  Eyes:     Conjunctiva/sclera: Conjunctivae normal.  Cardiovascular:     Rate and Rhythm: Normal rate and regular rhythm.     Heart sounds: Normal heart sounds. No murmur heard. No friction rub.  Pulmonary:     Effort: Pulmonary effort is normal. No respiratory distress.     Breath sounds: Normal breath sounds. No wheezing or rales.  Abdominal:     General: There is no distension.     Palpations: Abdomen is soft.     Tenderness: There is no abdominal tenderness.  Musculoskeletal:     Cervical back: Neck supple.  Skin:    General: Skin is warm.     Capillary Refill: Capillary refill takes less than 2 seconds.     Findings: No rash.  Neurological:     Mental Status: She is alert and oriented to person, place, and time.     Motor: No abnormal muscle tone.       ____________________________________________   LABS (all labs ordered are listed, but only abnormal results are displayed)  Labs Reviewed  BASIC METABOLIC PANEL - Abnormal; Notable for the following components:      Result Value   Glucose, Bld 101 (*)    All other components within normal limits  CBC  POC URINE PREG, ED  TROPONIN I (HIGH SENSITIVITY)  TROPONIN I (HIGH SENSITIVITY)    ____________________________________________  EKG: Normal sinus rhythm, VR  93. PR 120, QRS 122, QTc 447. No acute ST elevations. RBBB. ________________________________________  RADIOLOGY All imaging, including plain films, CT scans, and ultrasounds, independently reviewed by me, and interpretations confirmed via formal radiology reads.  ED MD interpretation:   CXR: Clear  Official radiology report(s): DG Chest 2 View  Result Date: 11/28/2020 CLINICAL DATA:  Chest pain. EXAM: CHEST - 2 VIEW COMPARISON:  Chest x-ray dated September 28, 2020. FINDINGS: The heart size and mediastinal contours are within normal limits. Both lungs are clear. The visualized skeletal structures are unremarkable. IMPRESSION: No active cardiopulmonary disease. Electronically Signed   By: Obie Dredge M.D.   On: 11/28/2020 14:12    ____________________________________________  PROCEDURES   Procedure(s) performed (including Critical Care):  Procedures  ____________________________________________  INITIAL IMPRESSION / MDM / ASSESSMENT AND PLAN / ED COURSE  As part of my medical decision making, I reviewed the following data within the electronic MEDICAL RECORD NUMBER Nursing notes reviewed and incorporated, Old chart reviewed, Notes from prior ED visits, and  Controlled Substance Database       *Karen Wheeler was evaluated in Emergency Department on 11/28/2020 for the symptoms described in the history of present illness. She was evaluated in the context of the global COVID-19 pandemic, which necessitated consideration that the patient might be at risk for infection with the SARS-CoV-2 virus that causes COVID-19. Institutional protocols and algorithms that pertain to the evaluation of patients at risk for COVID-19 are in a state of rapid change based on information released by regulatory bodies including the CDC and federal and state organizations. These policies and algorithms were followed during the patient's care in the ED.  Some ED evaluations and interventions may be delayed as a  result of limited staffing during the pandemic.*     Medical Decision Making: very pleasant 45 yo F here with atypical chest pain. HEART score is <3 and trop neg x 2, doubt ACS. She is not tachycardic, tachypneic, hypoxic, and is PERC negative - doubt PE. EKG is normal, without arrhythmia. No arrhythmia on telemetry. CBC without anemia or leukocytosis. Lytes wnl on BMP. Suspect possible atypical GI pain versus stress-related gastritis or atypical stress-related CP. No apparent emergent pathology. Will trial antacids as an outpt and encourage PCP f/u for further work-up.  ____________________________________________  FINAL CLINICAL IMPRESSION(S) / ED DIAGNOSES  Final diagnoses:  Atypical chest pain     MEDICATIONS GIVEN DURING THIS VISIT:  Medications  alum & mag hydroxide-simeth (MAALOX/MYLANTA) 200-200-20 MG/5ML suspension 30 mL (30 mLs Oral Given 11/28/20 1622)    And  lidocaine (XYLOCAINE) 2 % viscous mouth solution 15 mL (15 mLs Oral Not Given 11/28/20 1622)     ED Discharge Orders    None       Note:  This document was prepared using Dragon voice recognition software and may include unintentional dictation errors.   Shaune Pollack, MD 11/28/20 1800

## 2022-01-07 ENCOUNTER — Encounter: Payer: Self-pay | Admitting: Obstetrics

## 2022-01-07 ENCOUNTER — Ambulatory Visit: Payer: 59 | Admitting: Obstetrics

## 2022-01-07 ENCOUNTER — Other Ambulatory Visit (HOSPITAL_COMMUNITY)
Admission: RE | Admit: 2022-01-07 | Discharge: 2022-01-07 | Disposition: A | Discharge status: Home / Self Care | Payer: Self-pay | Source: Ambulatory Visit | Attending: Obstetrics | Admitting: Obstetrics

## 2022-01-07 VITALS — BP 122/70 | Wt 141.0 lb

## 2022-01-07 DIAGNOSIS — R232 Flushing: Secondary | ICD-10-CM | POA: Diagnosis not present

## 2022-01-07 DIAGNOSIS — N898 Other specified noninflammatory disorders of vagina: Secondary | ICD-10-CM | POA: Diagnosis not present

## 2022-01-07 MED ORDER — FLUCONAZOLE 150 MG PO TABS: 150.0000 mg | ORAL_TABLET | Freq: Once | ORAL | 0 refills | Status: AC

## 2022-01-07 NOTE — Progress Notes (Signed)
Karen Wheeler is a 46 y.o. G1P0010 who LMP was No LMP recorded., presents today for a problem visit.   Patient complains of an abnormal vaginal discharge for 4 days. Discharge described as: scant, white, and odorless. Vaginal symptoms include local irritation.   Other associated symptoms: none.Menstrual pattern: She had been bleeding regularly. Contraception: vasectomy.  She denies recent antibiotic exposure, denies changes in soaps, detergents coinciding with the onset of her symptoms.  She has not previously self treated or been under treatment by another provider for these symptoms. She shares that over the last year she has noticed that she sweats more, and has moredischarge associated with wearing her clothes.she applied some medicated powder to her perineal area and along her labia, and now feels that this has caused irritation. ?She is overdue for an Annual GYN physical. She mentions that she has been having hot flashes in the evenings.she wonders whether she may be perimenopausal. ?Karen Wheeler is single, and in a relationship. Her partner has had a vasectomy. ? ?BP 122/70   Wt 141 lb (64 kg)   BMI 23.46 kg/m?  ?Review of Systems  ?Constitutional: Negative.   ?HENT: Negative.    ?Eyes: Negative.   ?Respiratory: Negative.    ?Cardiovascular: Negative.   ?Gastrointestinal: Negative.   ?Genitourinary: Negative.   ?Musculoskeletal: Negative.   ?Skin:   ?     Describes irritation along her labia majora  ?Neurological: Negative.   ?Endo/Heme/Allergies: Negative.   ?Psychiatric/Behavioral: Negative.    ? ?Medical hx includes diagnosis of gential Herpes. ?Hx of abnormal pap smear in 2021- ASCUS with + High risk HPV- colposcopy showed benign changes only ? ?Wet prep: +yeast buds noted, moderate clue cells, faint whiff, negative hyphae ?Physical Exam ?Constitutional:   ?   Appearance: Normal appearance. She is normal weight.  ?HENT:  ?   Head: Normocephalic and atraumatic.  ?Cardiovascular:  ?   Rate and Rhythm:  Normal rate and regular rhythm.  ?   Pulses: Normal pulses.  ?   Heart sounds: Normal heart sounds.  ?Pulmonary:  ?   Effort: Pulmonary effort is normal.  ?   Breath sounds: Normal breath sounds.  ?Abdominal:  ?   Palpations: Abdomen is soft.  ?Genitourinary: ?   General: Normal vulva.  ?   Comments: No external lesions. Some slight pink to red areas along her labia majora. ?Spec exam shows moderate amount of clumpy white discharge in the vaginal vault. ?Aptima swab is retrieved. ?See POCT- + yeast buds noted, some clue cells, negative hyphae, faint whiff. ?Musculoskeletal:     ?   General: Normal range of motion.  ?Skin: ?   General: Skin is warm and dry.  ?Neurological:  ?   General: No focal deficit present.  ?   Mental Status: She is alert and oriented to person, place, and time.  ?Psychiatric:     ?   Mood and Affect: Mood normal.     ?   Behavior: Behavior normal.  ?   Comments: Shares feeling anxious about her complaint  ?  ?Vaginal discharge and labial irritation ?Suspect yeast vaginitis. My have a mixed yeast and BV ? ?1) Risk factors for bacterial vaginosis and candida infections discussed.  We discussed normal vaginal flora/microbiome.  Any factors that may alter the microbiome increase the risk of these opportunistic infections.  These include changes in pH, antibiotic exposures, diabetes, wet bathing suits etc.  We discussed that treatment is aimed at eradicating abnormal bacterial overgrowth and or  yeast.  There may be some role for vaginal probiotics in restoring normal vaginal flora.    ? ?We will review the aptima swab results. She is advised to purchase OTC Monistat and begin treatment. Will prescribe Diflucan tablet for her as well. ?She is encouraged to make an appointment for an Annual Well woman Exam/pap smear. ? ?Karen Wheeler, CNM  ?01/08/2022 7:21 PM  ?` ?

## 2022-01-08 LAB — POCT WET PREP (WET MOUNT): Trichomonas Wet Prep HPF POC: ABSENT

## 2022-01-09 ENCOUNTER — Encounter: Payer: Self-pay | Admitting: Obstetrics

## 2022-01-09 ENCOUNTER — Other Ambulatory Visit: Payer: Self-pay | Admitting: Certified Nurse Midwife

## 2022-01-09 LAB — CERVICOVAGINAL ANCILLARY ONLY
Bacterial Vaginitis (gardnerella): POSITIVE — AB
Candida Glabrata: NEGATIVE
Candida Vaginitis: POSITIVE — AB
Comment: NEGATIVE
Comment: NEGATIVE
Comment: NEGATIVE

## 2022-01-09 MED ORDER — METRONIDAZOLE 500 MG PO TABS: 500.0000 mg | ORAL_TABLET | Freq: Two times a day (BID) | ORAL | 0 refills | Status: AC

## 2022-01-09 MED ORDER — FLUCONAZOLE 150 MG PO TABS: 150.0000 mg | ORAL_TABLET | Freq: Every day | ORAL | 0 refills | Status: DC

## 2022-01-13 ENCOUNTER — Encounter: Payer: Self-pay | Admitting: Obstetrics

## 2022-01-13 ENCOUNTER — Other Ambulatory Visit: Payer: Self-pay | Admitting: Obstetrics

## 2022-01-13 NOTE — Progress Notes (Unsigned)
macrobi

## 2022-01-29 ENCOUNTER — Ambulatory Visit
Admission: RE | Admit: 2022-01-29 | Discharge: 2022-01-29 | Disposition: A | Discharge status: Home / Self Care | Payer: 59 | Source: Ambulatory Visit | Attending: Emergency Medicine | Admitting: Emergency Medicine

## 2022-01-29 VITALS — BP 103/76 | HR 85 | Temp 98.1°F | Resp 16

## 2022-01-29 DIAGNOSIS — J029 Acute pharyngitis, unspecified: Secondary | ICD-10-CM | POA: Diagnosis not present

## 2022-01-29 DIAGNOSIS — J988 Other specified respiratory disorders: Secondary | ICD-10-CM | POA: Diagnosis not present

## 2022-01-29 DIAGNOSIS — B9789 Other viral agents as the cause of diseases classified elsewhere: Secondary | ICD-10-CM | POA: Insufficient documentation

## 2022-01-29 LAB — POCT RAPID STREP A (OFFICE): Rapid Strep A Screen: NEGATIVE

## 2022-01-29 NOTE — ED Provider Notes (Signed)
HPI ? ?SUBJECTIVE: ? ?Karen Wheeler is a 46 y.o. female who presents with 3 days of sore throat, bilateral ear pain, nasal congestion, fatigue, frontal headache, clear/yellow rhinorrhea, postnasal drip, cough productive of the same material as her nasal congestion.  No fevers, body aches, sinus pain or pressure, facial swelling, upper dental pain, wheezing, shortness of breath, nausea, vomiting, diarrhea, abdominal pain, rash.  No drooling, trismus, voice changes, neck stiffness, sensation of throat swelling shut.  No otorrhea, change in hearing, tinnitus, vertigo. No allergy or GERD symptoms.  No known COVID exposure.  She did not get the COVID-vaccine.  She has multiple sick contacts with similar symptoms at work.  She took Alka-Seltzer cold and flu with improvement in her symptoms.  Last dose was within 6 hours of evaluation.  She has also been taking Flonase with improvement.  No aggravating factors.  She has had COVID twice, last time in January 2023 and also has a history of allergies.  LMP: "Soon".  Denies possibility of being pregnant.  PCP[facility]. ? ? ?Past Medical History:  ?Diagnosis Date  ? Herpes genitalis   ? Irritable bowel syndrome (IBS)   ? Tinea versicolor   ? ? ?Past Surgical History:  ?Procedure Laterality Date  ? WISDOM TOOTH EXTRACTION    ? ? ?Family History  ?Problem Relation Age of Onset  ? Hypertension Father   ? Other Sister   ?     cardiac anomaly, congenital  ? Stroke Sister 87  ?     had valve replacement  ? Kidney cancer Maternal Grandmother   ?     ?lung cancer  ? COPD Maternal Grandmother   ? Diabetes Other   ?     type 2  ? ? ?Social History  ? ?Tobacco Use  ? Smoking status: Former  ?  Packs/day: 0.50  ?  Types: Cigarettes  ? Smokeless tobacco: Former  ?  Quit date: 06/14/2018  ?Vaping Use  ? Vaping Use: Never used  ?Substance Use Topics  ? Alcohol use: Yes  ?  Comment: 4/ month  ? Drug use: Never  ? ? ?No current facility-administered medications for this  encounter. ? ?Current Outpatient Medications:  ?  valACYclovir (VALTREX) 1000 MG tablet, TAKE ONE HALF (1/2) TABLET BY MOUTH DAILY, Disp: 45 tablet, Rfl: 3 ? ?No Known Allergies ? ? ?ROS ? ?As noted in HPI.  ? ?Physical Exam ? ?BP 103/76 (BP Location: Left Arm)   Pulse 85   Temp 98.1 ?F (36.7 ?C) (Oral)   Resp 16   LMP  (LMP Unknown)   SpO2 100%  ? ?Constitutional: Well developed, well nourished, no acute distress ?Eyes:  EOMI, conjunctiva normal bilaterally ?HENT: Normocephalic, atraumatic,mucus membranes moist.  External ears, EACs, TMs normal bilaterally.  No pain with traction on pinna, palpation of tragus or mastoid bilaterally.  TMJ nontender, no crepitus bilaterally.  Hearing intact and equal bilaterally.  Positive nasal congestion.  Positive mild maxillary sinus tenderness bilaterally.  No frontal sinus tenderness.  Normal tonsils without exudates.  Slightly erythematous oropharynx.  Uvula midline.  No petechiae on palate.  Positive postnasal drip. ?Neck: Positive cervical lymphadenopathy ?Respiratory: Normal inspiratory effort, lungs clear bilaterally  ?cardiovascular: Normal rate, regular rhythm, no murmurs rubs or gallops ?GI: nondistended soft, nontender, no splenomegaly ?skin: No rash, skin intact ?Musculoskeletal: no deformities ?Neurologic: Alert & oriented x 3, no focal neuro deficits ?Psychiatric: Speech and behavior appropriate ? ? ?ED Course ? ? ?Medications - No data to  display ? ?Orders Placed This Encounter  ?Procedures  ? Culture, group A strep  ?  Standing Status:   Standing  ?  Number of Occurrences:   1  ? POCT rapid strep A  ?  Standing Status:   Standing  ?  Number of Occurrences:   1  ? ? ?Results for orders placed or performed during the hospital encounter of 01/29/22 (from the past 24 hour(s))  ?POCT rapid strep A     Status: None  ? Collection Time: 01/29/22  2:15 PM  ?Result Value Ref Range  ? Rapid Strep A Screen Negative Negative  ? ?No results found. ? ?ED Clinical  Impression ? ?1. Viral respiratory infection   ?2. Acute pharyngitis, unspecified etiology   ?  ? ?ED Assessment/Plan ? ?Rapid strep negative.  Sending off for culture.  Will call in amoxicillin if culture comes back positive for strep.  In the meantime, presentation consistent with a viral upper respiratory infection.  Patient declined COVID testing, although she would qualify for antivirals based on unvaccinated status.  Given that she had COVID several months ago, feel that this is reasonable.  She is to continue Alka-Seltzer cold and flu, Flonase, start saline nasal irrigation, Benadryl/Maalox mixture, work note.  Return here in 5 to 7 days if not better, and we can consider antibiotic therapy at that time. ? ?Discussed labs MDM, treatment plan, and plan for follow-up with patient.  patient agrees with plan.  ? ?No orders of the defined types were placed in this encounter. ? ? ? ? ?*This clinic note was created using Scientist, clinical (histocompatibility and immunogenetics). Therefore, there may be occasional mistakes despite careful proofreading. ? ?? ? ?  ?Domenick Gong, MD ?01/29/22 1532 ? ?

## 2022-01-29 NOTE — ED Triage Notes (Signed)
Pt c/o ST and bilateral ear pain x 2 days.  ?

## 2022-01-29 NOTE — Discharge Instructions (Addendum)
Continue Alka-Seltzer cold and flu, Flonase.  Start saline nasal irrigation with a Milta Deiters Med rinse and distilled water as often as you want. ?your rapid strep was negative today, so we have sent off a throat culture.  We will contact you and call in the appropriate antibiotics if your culture comes back positive for an infection requiring antibiotic treatment.  Give [place] a working phone number.   Make sure you drink plenty of extra fluids.  Some people find salt water gargles and  Traditional Medicinal's "Throat Coat" tea helpful. Take 5 mL of liquid Benadryl and 5 mL of Maalox. Mix it together, and then hold it in your mouth for as long as you can and then swallow. You may do this 4 times a day.   ? ?Honey dissolved in hot water with lemon can also be helpful for sore throat. ? ?Go to www.goodrx.com  or www.costplusdrugs.com to look up your medications. This will give you a list of where you can find your prescriptions at the most affordable prices. Or ask the pharmacist what the cash price is, or if they have any other discount programs available to help make your medication more affordable. This can be less expensive than what you would pay with insurance.   ?

## 2022-02-01 LAB — CULTURE, GROUP A STREP (THRC)

## 2022-02-03 ENCOUNTER — Ambulatory Visit: Payer: 59 | Admitting: Obstetrics

## 2022-02-04 ENCOUNTER — Encounter: Payer: Self-pay | Admitting: Obstetrics

## 2022-02-04 ENCOUNTER — Ambulatory Visit (INDEPENDENT_AMBULATORY_CARE_PROVIDER_SITE_OTHER): Payer: 59 | Admitting: Obstetrics

## 2022-02-04 ENCOUNTER — Other Ambulatory Visit (HOSPITAL_COMMUNITY)
Admission: RE | Admit: 2022-02-04 | Discharge: 2022-02-04 | Disposition: A | Discharge status: Home / Self Care | Payer: 59 | Source: Ambulatory Visit | Attending: Obstetrics | Admitting: Obstetrics

## 2022-02-04 VITALS — BP 120/80 | Ht 66.0 in | Wt 133.0 lb

## 2022-02-04 DIAGNOSIS — B009 Herpesviral infection, unspecified: Secondary | ICD-10-CM

## 2022-02-04 DIAGNOSIS — Z01419 Encounter for gynecological examination (general) (routine) without abnormal findings: Secondary | ICD-10-CM | POA: Insufficient documentation

## 2022-02-04 DIAGNOSIS — D1801 Hemangioma of skin and subcutaneous tissue: Secondary | ICD-10-CM | POA: Diagnosis not present

## 2022-02-04 DIAGNOSIS — Z1231 Encounter for screening mammogram for malignant neoplasm of breast: Secondary | ICD-10-CM

## 2022-02-04 DIAGNOSIS — Z124 Encounter for screening for malignant neoplasm of cervix: Secondary | ICD-10-CM | POA: Diagnosis not present

## 2022-02-04 MED ORDER — VALACYCLOVIR HCL 1 G PO TABS: ORAL_TABLET | ORAL | 3 refills | Status: DC

## 2022-02-04 NOTE — Progress Notes (Signed)
Gynecology Annual Exam  PCP: Patient, No Pcp Per (Inactive)  Chief Complaint:  Chief Complaint  Patient presents with   Annual Exam    Need refill on valtrex    History of Present Illness: Patient is a 46 y.o. G1P0010 presents for annual exam. The patient has no complaints today.   LMP: Patient's last menstrual period was 02/02/2022. Average Interval: regular, 28 days Duration of flow: 6 days Heavy Menses: no Clots: no Intermenstrual Bleeding: no Postcoital Bleeding: no Dysmenorrhea: no   The patient is sexually active. She currently uses vasectomy for contraception. She denies dyspareunia.  The patient does perform self breast exams.  There is no notable family history of breast or ovarian cancer in her family.  The patient wears seatbelts: yes.   The patient has regular exercise: yes.    The patient denies current symptoms of depression.    Review of Systems: Review of Systems  Constitutional: Negative.   HENT: Negative.    Eyes: Negative.   Respiratory: Negative.    Cardiovascular: Negative.   Gastrointestinal: Negative.   Genitourinary: Negative.   Musculoskeletal: Negative.   Skin: Negative.   Neurological: Negative.   Endo/Heme/Allergies: Negative.   Psychiatric/Behavioral: Negative.     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 ASCUS with positive HRHPV 03/07/2020-->colposcopy scheduled     Genital herpes 10/29/2018    Past Surgical History:  Past Surgical History:  Procedure Laterality Date   WISDOM TOOTH EXTRACTION      Gynecologic History:  Patient's last menstrual period was 02/02/2022. Contraception: vasectomy Last Pap: Results were: ASCUS ASCUS with POSITIVE high risk HPV  Last mammogram: never done   Ordered today  Obstetric History: G1P0010  Family History:  Family History  Problem Relation Age of Onset    Hypertension Father    Other Sister        cardiac anomaly, congenital   Stroke Sister 55       had valve replacement   Kidney cancer Maternal Grandmother        ?lung cancer   COPD Maternal Grandmother    Diabetes Other        type 2    Social History:  Social History   Socioeconomic History   Marital status: Single    Spouse name: Not on file   Number of children: 0   Years of education: Not on file   Highest education level: Not on file  Occupational History   Occupation: Scientific laboratory technician  Tobacco Use   Smoking status: Former    Packs/day: 0.50    Types: Cigarettes   Smokeless tobacco: Former    Quit date: 06/14/2018  Vaping Use   Vaping Use: Never used  Substance and Sexual Activity   Alcohol use: Yes    Comment: 4/ month   Drug use: Never   Sexual activity: Yes    Partners: Male    Birth control/protection: Condom  Other Topics Concern   Not on file  Social History Narrative   Not on file   Social Determinants of Health   Financial Resource Strain: Not on file  Food Insecurity: Not on file  Transportation Needs: Not on file  Physical Activity: Not on file  Stress: Not on file  Social Connections: Not on file  Intimate Partner Violence: Not on file    Allergies:  No Known Allergies  Medications: Prior to Admission medications   Medication Sig Start Date End Date Taking? Authorizing Provider  valACYclovir (VALTREX) 1000 MG tablet TAKE ONE HALF (1/2) TABLET BY MOUTH DAILY 02/04/22   Mirna Mires, CNM    Physical Exam Vitals: Blood pressure 120/80, height 5\' 6"  (1.676 m), weight 133 lb (60.3 kg), last menstrual period 02/02/2022.  General: NAD HEENT: normocephalic, anicteric Thyroid: no enlargement, no palpable nodules Pulmonary: No increased work of breathing, CTAB Cardiovascular: RRR, distal pulses 2+ Breast: Breast symmetrical, no tenderness, no palpable nodules or masses, no skin or nipple retraction present, no nipple discharge.  No  axillary or supraclavicular lymphadenopathy. Abdomen: NABS, soft, non-tender, non-distended.  Umbilicus without lesions.  No hepatomegaly, splenomegaly or masses palpable. No evidence of hernia  Genitourinary:  External: Normal external female genitalia.  Normal urethral meatus, normal Bartholin's and Skene's glands.    Vagina: Normal vaginal mucosa, no evidence of prolapse.    Cervix: Grossly normal in appearance, no bleeding  Uterus: Non-enlarged, mobile, normal contour.  No CMT  Adnexa: ovaries non-enlarged, no adnexal masses  Rectal: deferred  Lymphatic: no evidence of inguinal lymphadenopathy Extremities: no edema, erythema, or tenderness Neurologic: Grossly intact Psychiatric: mood appropriate, affect full  Female chaperone present for pelvic and breast  portions of the physical exam    Assessment: 46 y.o. G1P0010 routine annual exam  Plan: Problem List Items Addressed This Visit   None Visit Diagnoses     HSV (herpes simplex virus) infection    -  Primary   Relevant Medications   valACYclovir (VALTREX) 1000 MG tablet   Women's annual routine gynecological examination       Relevant Orders   Cytology - PAP   Cervical cancer screening       Relevant Orders   Cytology - PAP       1) Mammogram - recommend yearly screening mammogram.  Mammogram Was ordered today   2) STI screening  wasoffered and declined  3) ASCCP guidelines and rational discussed.  Patient opts for yearly screening interval  4) Contraception - the patient is currently using  vasectomy.  She is happy with her current form of contraception and plans to continue  5) Colonoscopy -- Screening recommended starting at age 31 for average risk individuals, age 89 for individuals deemed at increased risk (including African Americans) and recommended to continue until age 28.  For patient age 32-85 individualized approach is recommended.  Gold standard screening is via colonoscopy, Cologuard screening is an  acceptable alternative for patient unwilling or unable to undergo colonoscopy.  "Colorectal cancer screening for average?risk adults: 2018 guideline update from the American Cancer Society"CA: A Cancer Journal for Clinicians: Feb 11, 2017   6) Routine healthcare maintenance including cholesterol, diabetes screening discussed managed by PCP  7) Follow up in one year. I have reordered her Valtrex.   February 13, 2017, CNM  02/04/2022 9:03 AM   [facility], [facility] 02/04/2022, 9:03 AM

## 2022-02-07 LAB — CYTOLOGY - PAP
Comment: NEGATIVE
Comment: NEGATIVE
Comment: NEGATIVE
Diagnosis: HIGH — AB
HPV 16: NEGATIVE
HPV 18 / 45: NEGATIVE
High risk HPV: POSITIVE — AB

## 2022-02-16 ENCOUNTER — Encounter: Payer: Self-pay | Admitting: Obstetrics

## 2022-02-16 NOTE — Progress Notes (Signed)
Patient with hx of abnormal pap smears and +high risk HPV now has a "Atypical squamous cells" result. She needs colposcopy, and a note has been sent to her advising this.

## 2022-02-25 ENCOUNTER — Ambulatory Visit: Payer: 59 | Admitting: Family Medicine

## 2022-03-04 ENCOUNTER — Encounter: Payer: Self-pay | Admitting: Obstetrics

## 2022-03-04 ENCOUNTER — Other Ambulatory Visit (HOSPITAL_COMMUNITY)
Admission: RE | Admit: 2022-03-04 | Discharge: 2022-03-04 | Disposition: A | Discharge status: Home / Self Care | Payer: 59 | Source: Ambulatory Visit | Attending: Family Medicine | Admitting: Family Medicine

## 2022-03-04 ENCOUNTER — Ambulatory Visit (INDEPENDENT_AMBULATORY_CARE_PROVIDER_SITE_OTHER): Payer: 59 | Admitting: Obstetrics

## 2022-03-04 VITALS — BP 110/70 | Resp 16 | Wt 136.0 lb

## 2022-03-04 DIAGNOSIS — N72 Inflammatory disease of cervix uteri: Secondary | ICD-10-CM | POA: Diagnosis not present

## 2022-03-04 DIAGNOSIS — R87611 Atypical squamous cells cannot exclude high grade squamous intraepithelial lesion on cytologic smear of cervix (ASC-H): Secondary | ICD-10-CM | POA: Insufficient documentation

## 2022-03-04 DIAGNOSIS — N898 Other specified noninflammatory disorders of vagina: Secondary | ICD-10-CM | POA: Diagnosis not present

## 2022-03-04 LAB — POCT URINE PREGNANCY: Preg Test, Ur: NEGATIVE

## 2022-03-04 MED ORDER — FLUCONAZOLE 150 MG PO TABS: 150.0000 mg | ORAL_TABLET | Freq: Once | ORAL | 0 refills | Status: AC

## 2022-03-04 NOTE — Addendum Note (Signed)
Addended by: Fonda Kinder on: 03/04/2022 04:13 PM   Modules accepted: Orders

## 2022-03-04 NOTE — Progress Notes (Signed)
Chief Complaint  Patient presents with  . Colposcopy   Colposcopy Note  Patient Karen Wheeler is an 46 y.o. year old G1P0010 Patient's last menstrual period was 02/24/2022 (exact date). currently vasecomy for contraception who presents for colposcopy.   Patient is sent in consult from [facility] Last pap ASC-H HPV pos genotype neg; date 02/04/22 Prior cervical/vaginal/vulvar disease and treatment : Yes  LGSIL on Pap smear of cervix 11/05/2018      With positive HRHPV -coloposcopy: CIN1 in 2020. ASCUS with positive HRHPV 03/07/2020-->colposcopy 05/29/20 benign     The patient is formeryly a smoker.  Period Duration (Days): 5-7 Period Pattern: (!) Irregular Menstrual Flow: Moderate Dysmenorrhea: None The patient is sexually active. History of HSV. Denies domestic violence or sexual abuse.   Procedure for colposcopy and biopsy has been explained to the patient and consent has been obtained.  The HPV virus has been discussed.  The patient has not received the Gardisil vaccine.  UPT negative today.   BP 110/70   Resp 16   Wt 136 lb (61.7 kg)   LMP 02/24/2022 (Exact Date)   BMI 21.95 kg/m  Physical Exam Exam conducted with a chaperone present.  Genitourinary:    Procedure Note: Colposcopy   With patient in lithotomy position, the speculum was placed without difficulty. The entire cervix was visualized.  The cervix was swabbed with acetic acid solution. Green light filter used: yes  FINDINGS: Normal external genitalia without lesions(vulvar colposcopy not performed.) Normal vagina without visible lesions but clumpy white discharge noted Transformation zone clearly visualized: yes Cervix: as per image above No Cervical biopsies taken o'clock.   Endocervical curettage performed. Biopsy sites were hemostatic Patient tolerated procedure well.  ASSESSMENT:  Atypical squamous cells cannot exclude high grade squamous intraepithelial lesion on cytologic smear of cervix (ASC-H) -  Plan: POCT urine pregnancy, CANCELED: POC Urinalysis Dipstick OB  Vaginal discharge - Plan: NuSwab Vaginitis (VG), fluconazole (DIFLUCAN) 150 MG tablet normal exam without visible pathology Satisfactory Colposcopy: Yes.    PLAN:    Specimens labeled and sent to Pathology. Pathology results will determine plan of care.  Will call patient with results.  Patient was instructed: nothing per vagina x 1 week, including intercourse. Patientt is aware that brown/black discharge or light bleeding may occur. VG obtained diflucan for probable yeast   Return if symptoms worsen or fail to improve, for annual/well woman.

## 2022-03-04 NOTE — Patient Instructions (Signed)
Nothing in vagina for 1 week, including intercourse.  Brown/black discharge or light bleeding may occur for a few days.  We will call you with pathology results in a few days. Call for severe pain, bleeding greater than one pad an hour, temperature above 100.4, or foul smelling discharge.  Colposcopy: What to Expect at Home Your Recovery You may feel some soreness in your vagina for a day or two if you had a biopsy. Some vaginal bleeding or discharge is normal for up to a week after a biopsy. The discharge may be dark-colored if a solution was put on your cervix. You can use a sanitary pad for the bleeding. It may take a week or two for you to get the test results. This care sheet gives you a general idea about how long it will take for you to recover. But each person recovers at a different pace. Follow the steps below to feel better as quickly as possible. How can you care for yourself at home? Activity You can return to work and most daily activities right after the test. Exercise Do not exercise for 1 day after the test. Medicines  Your doctor will tell you if and when you can restart your medicines. He or she will also give you instructions about taking any new medicines. If you take aspirin or some other blood thinner, ask your doctor if and when to start taking it again. Make sure that you understand exactly what your doctor wants you to do. Take an over-the-counter pain medicine, such as acetaminophen (Tylenol), ibuprofen (Advil, Motrin), or naproxen (Aleve). Be safe with medicines. Read and follow all instructions on the label. Do not take two or more pain medicines at the same time unless the doctor told you to. Many pain medicines have acetaminophen, which is Tylenol. Too much acetaminophen (Tylenol) can be harmful. Other instructions Use a pad if you have some bleeding. Do not douche, have sexual intercourse, or use tampons for 1 week if you had a biopsy. This will allow time for your  cervix to heal. You can take a bath or shower anytime after the test. Follow-up care is a key part of your treatment and safety. Be sure to make and go to all appointments, and call your doctor if you are having problems. It's also a good idea to know your test results and keep a list of the medicines you take. When should you call for help? Call your doctor now or seek immediate medical care if: You have severe vaginal bleeding. This means that you are soaking through your usual pads or tampons each hour for 2 or more hours. You have pain that does not get better after you take pain medicine. You have signs of infection, such as: Increased pain. Bad-smelling vaginal discharge. A fever. Watch closely for any changes in your health, and be sure to contact your doctor if: You have questions or concerns. Where can you learn more? Go to the "Search Medical Library" option found under the Resources tab in [facility] [email or web address]. If you do not have access to [facility], you can visit [email or web address] and select Health Library from the menu on the left. Enter M523 in the search box to learn more about Colposcopy: What to Expect at Home. Not on [facility]? Go to [email or web address] and click the "Sign Up Now" link to request an activation code. Current as of: May 06, 2018               Content Version:   12.5  2006-2020 [employer].  Care instructions adapted under license by your healthcare professional. This care instruction is for use with your licensed healthcare professional. If you have questions about a medical condition or this instruction, always ask your healthcare professional. [employer] disclaims any warranty or liability for your use of this information.  

## 2022-03-07 LAB — SURGICAL PATHOLOGY

## 2022-03-10 NOTE — Progress Notes (Signed)
Pt aware of results and to follow up in one year for a repeat pap

## 2022-03-12 LAB — NUSWAB VAGINITIS (VG)
Candida albicans, NAA: POSITIVE — AB
Candida glabrata, NAA: NEGATIVE
Trich vag by NAA: NEGATIVE

## 2022-03-25 ENCOUNTER — Ambulatory Visit
Admission: RE | Admit: 2022-03-25 | Discharge: 2022-03-25 | Disposition: A | Discharge status: Home / Self Care | Payer: 59 | Source: Ambulatory Visit | Attending: Obstetrics | Admitting: Obstetrics

## 2022-03-25 DIAGNOSIS — Z01419 Encounter for gynecological examination (general) (routine) without abnormal findings: Secondary | ICD-10-CM | POA: Diagnosis not present

## 2022-03-25 DIAGNOSIS — Z1231 Encounter for screening mammogram for malignant neoplasm of breast: Secondary | ICD-10-CM | POA: Insufficient documentation

## 2023-01-14 IMAGING — DX DG CHEST 2V
2 series · 2 of 2 positions shown · non-contrast
Comparison: None.

CLINICAL DATA: Chest tightness.  Coronavirus infection.

EXAM:
CHEST - 2 VIEW

[chest pa]
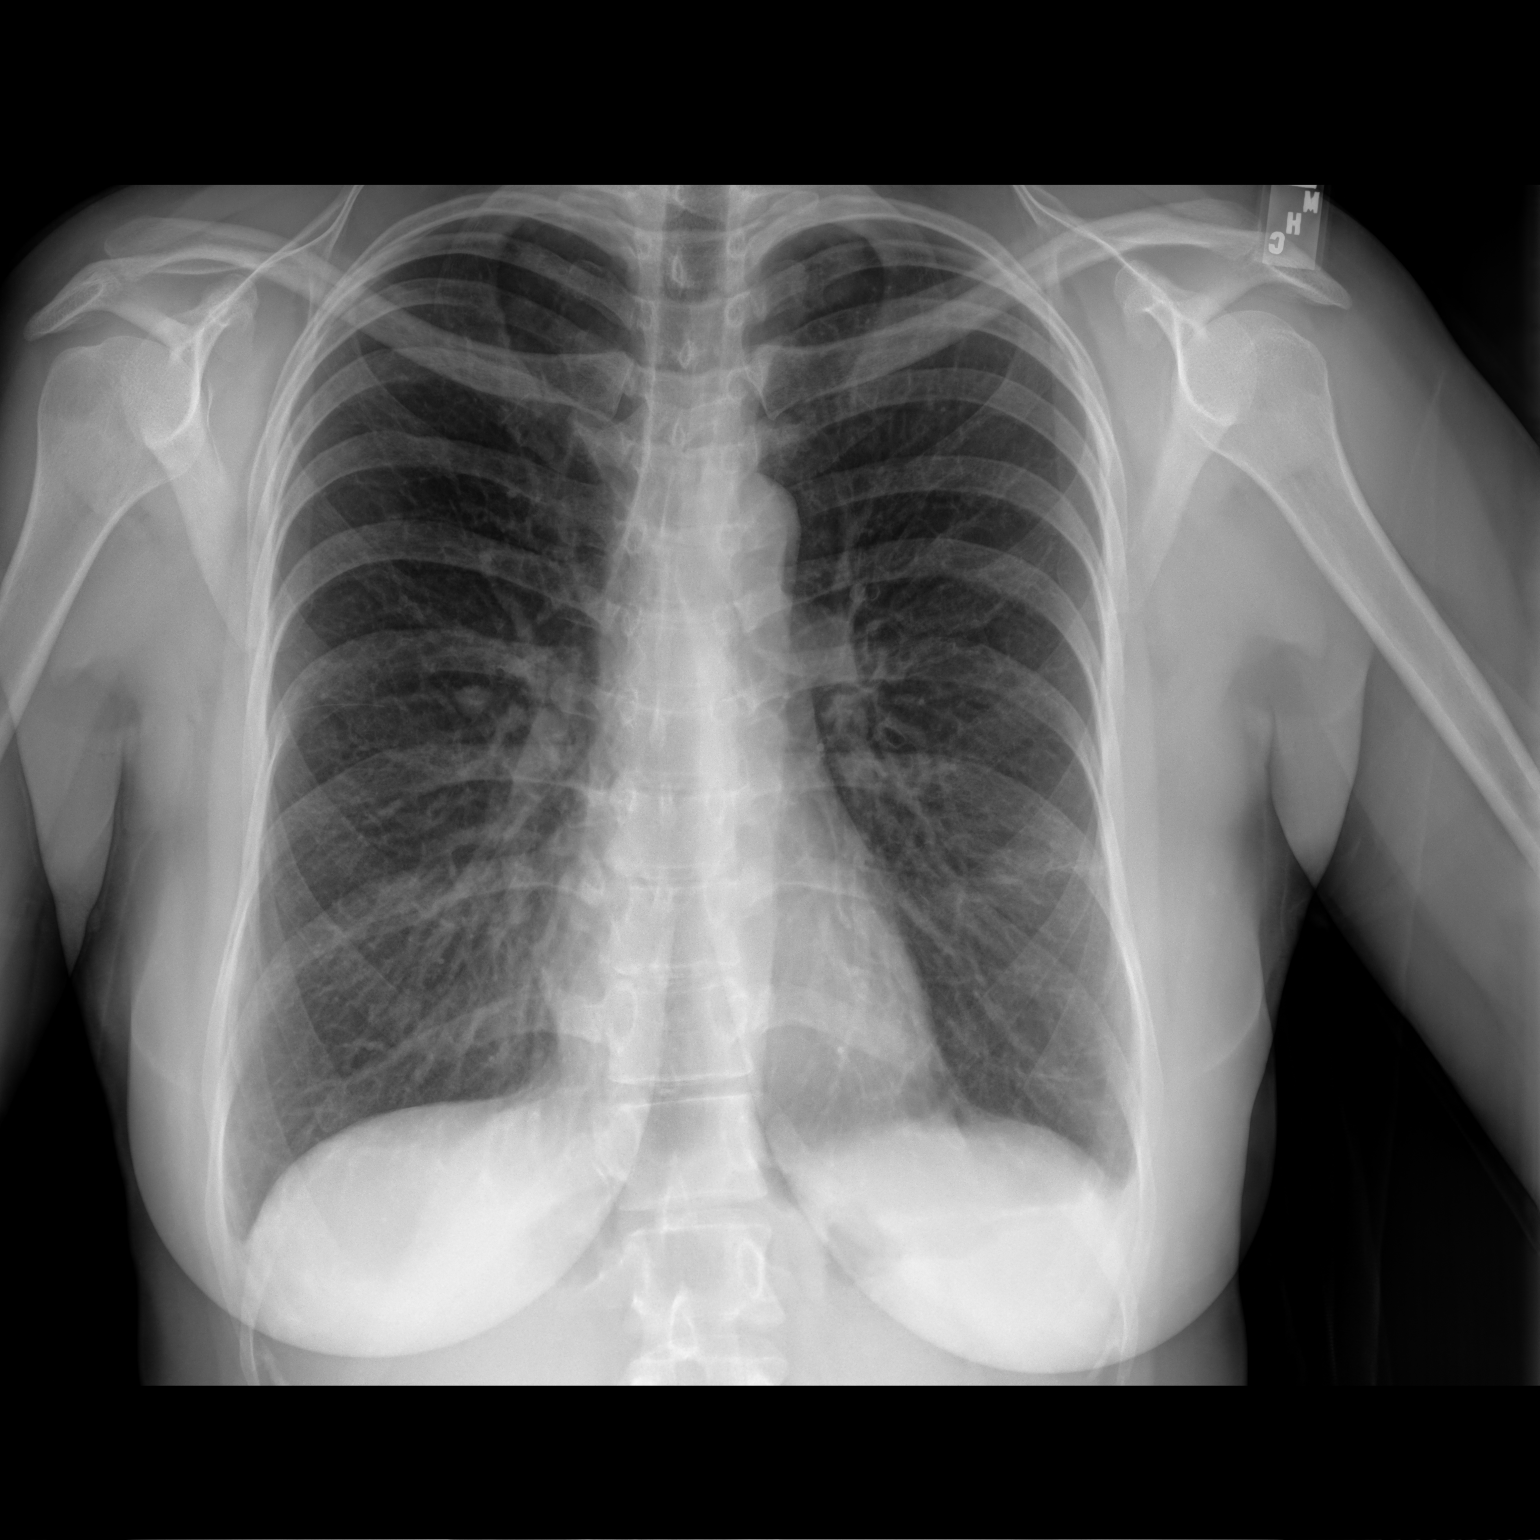

[chest lat]
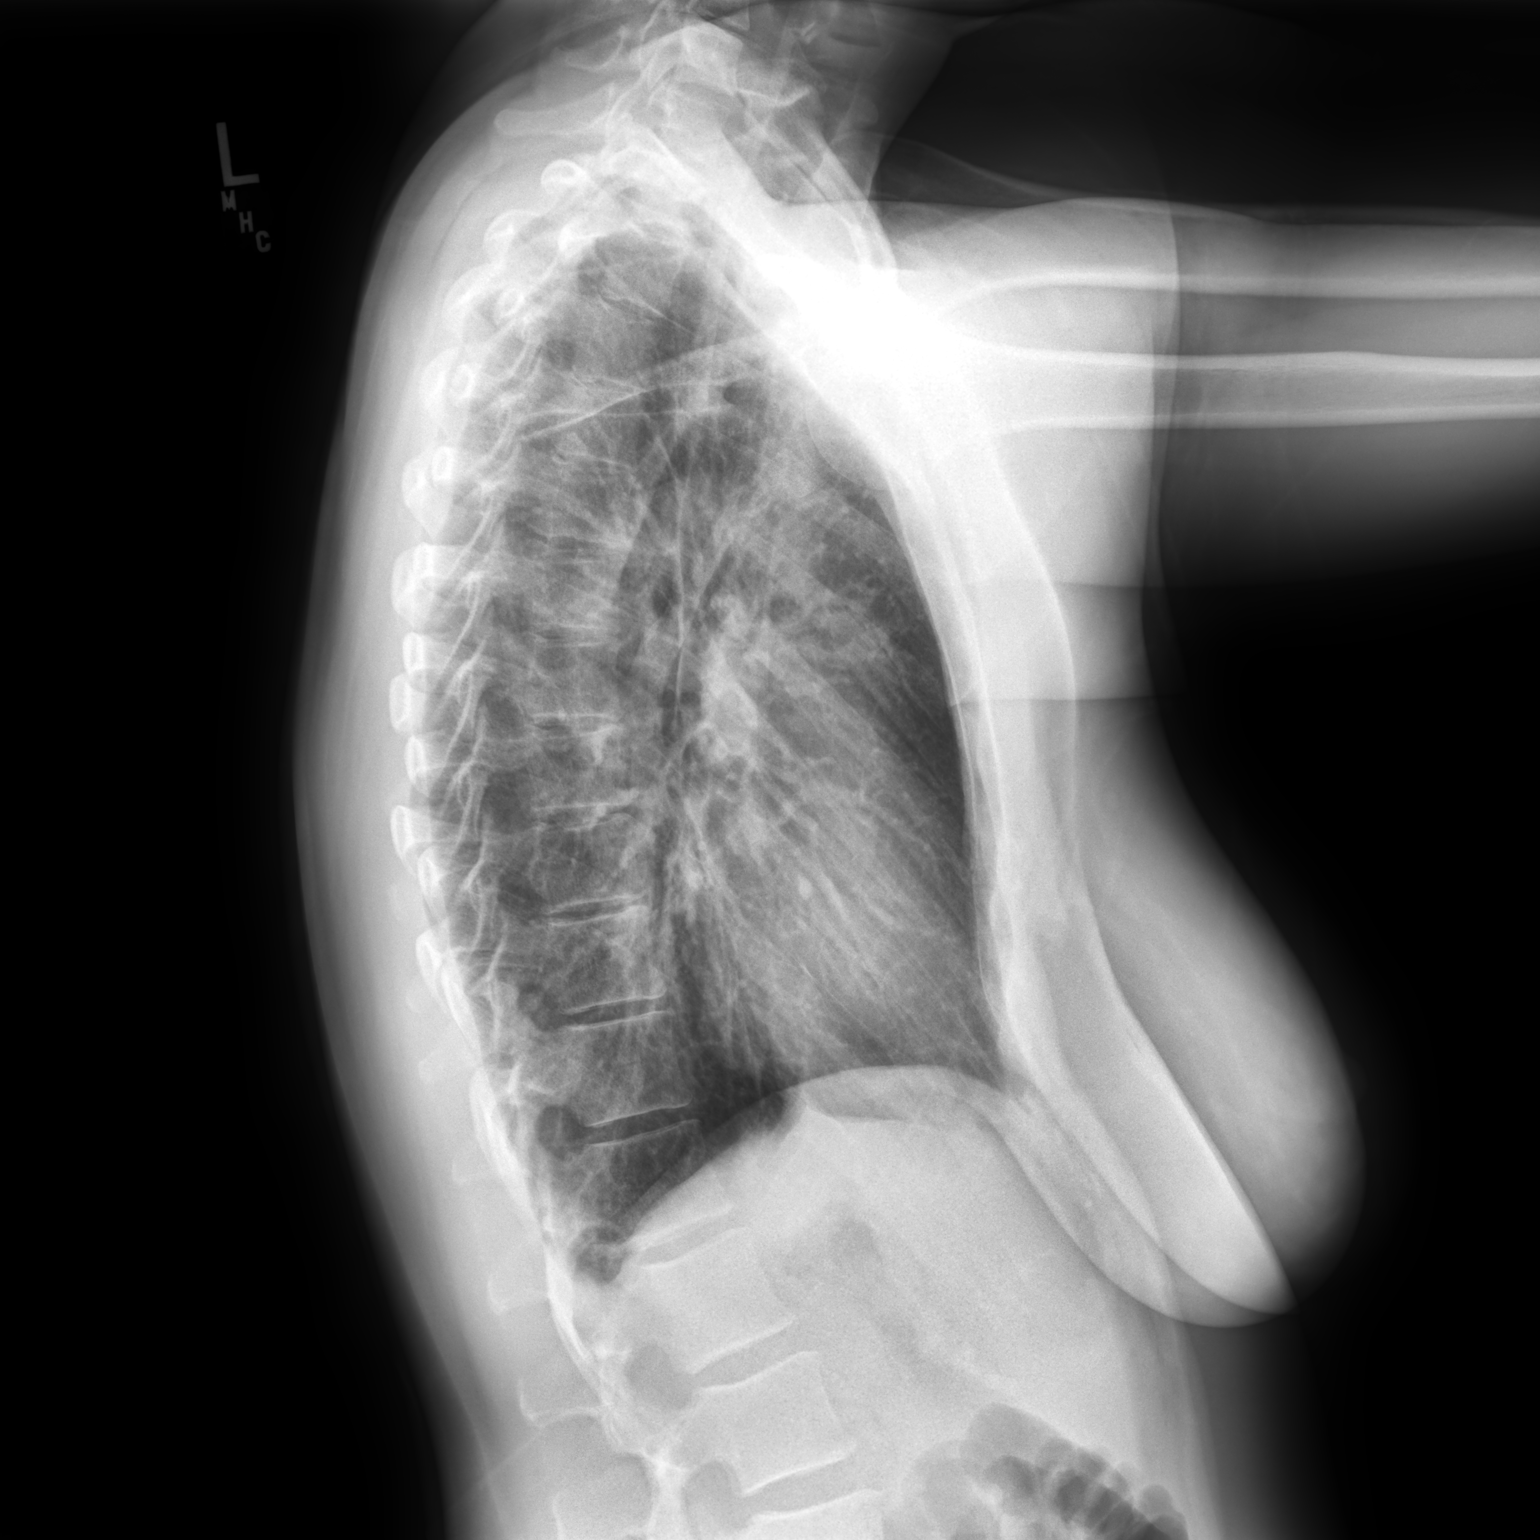

[2 of 2 positions shown; findings below may reference images not displayed]

FINDINGS: Heart size is normal. Mediastinal shadows are normal. Question few
small areas of patchy infiltrate in the mid and lower lungs, not
conclusively related to acute pneumonia. These could be areas of
scarring. No visible effusion. No significant bone finding.
IMPRESSION: Question small areas of patchy infiltrate in the mid and lower lungs
which could be areas of scarring or mild pneumonia. No advanced
pulmonary pathology visible by radiography.

## 2023-02-22 ENCOUNTER — Other Ambulatory Visit: Payer: Self-pay | Admitting: Obstetrics

## 2023-02-22 DIAGNOSIS — B009 Herpesviral infection, unspecified: Secondary | ICD-10-CM

## 2023-03-16 IMAGING — CR DG CHEST 2V
1 series · 2 of 2 positions shown · non-contrast
Comparison: Chest x-ray dated September 28, 2020.

CLINICAL DATA: Chest pain.

EXAM:
CHEST - 2 VIEW

[Series 1: dg chest 2 view · 0.14mm/px · 2 of 2 slices shown]
[im 1/2]
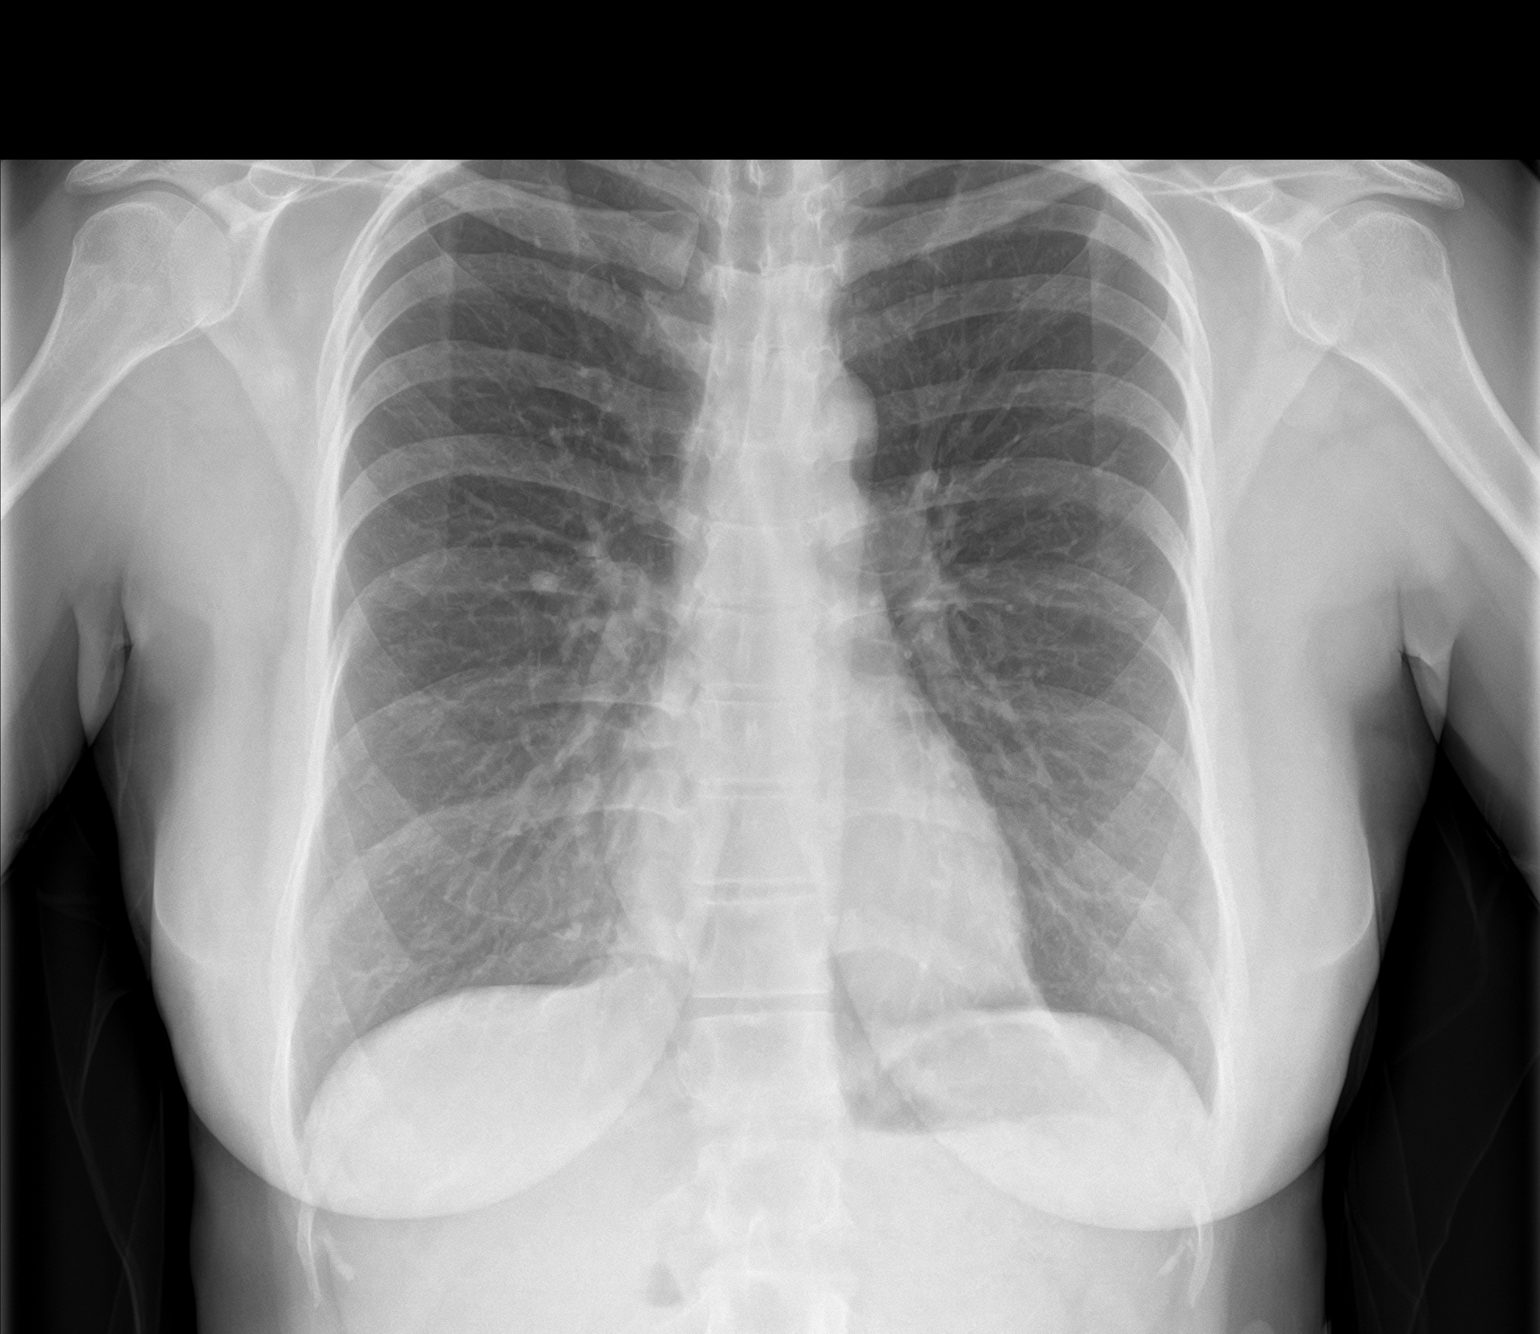
[im 2/2]
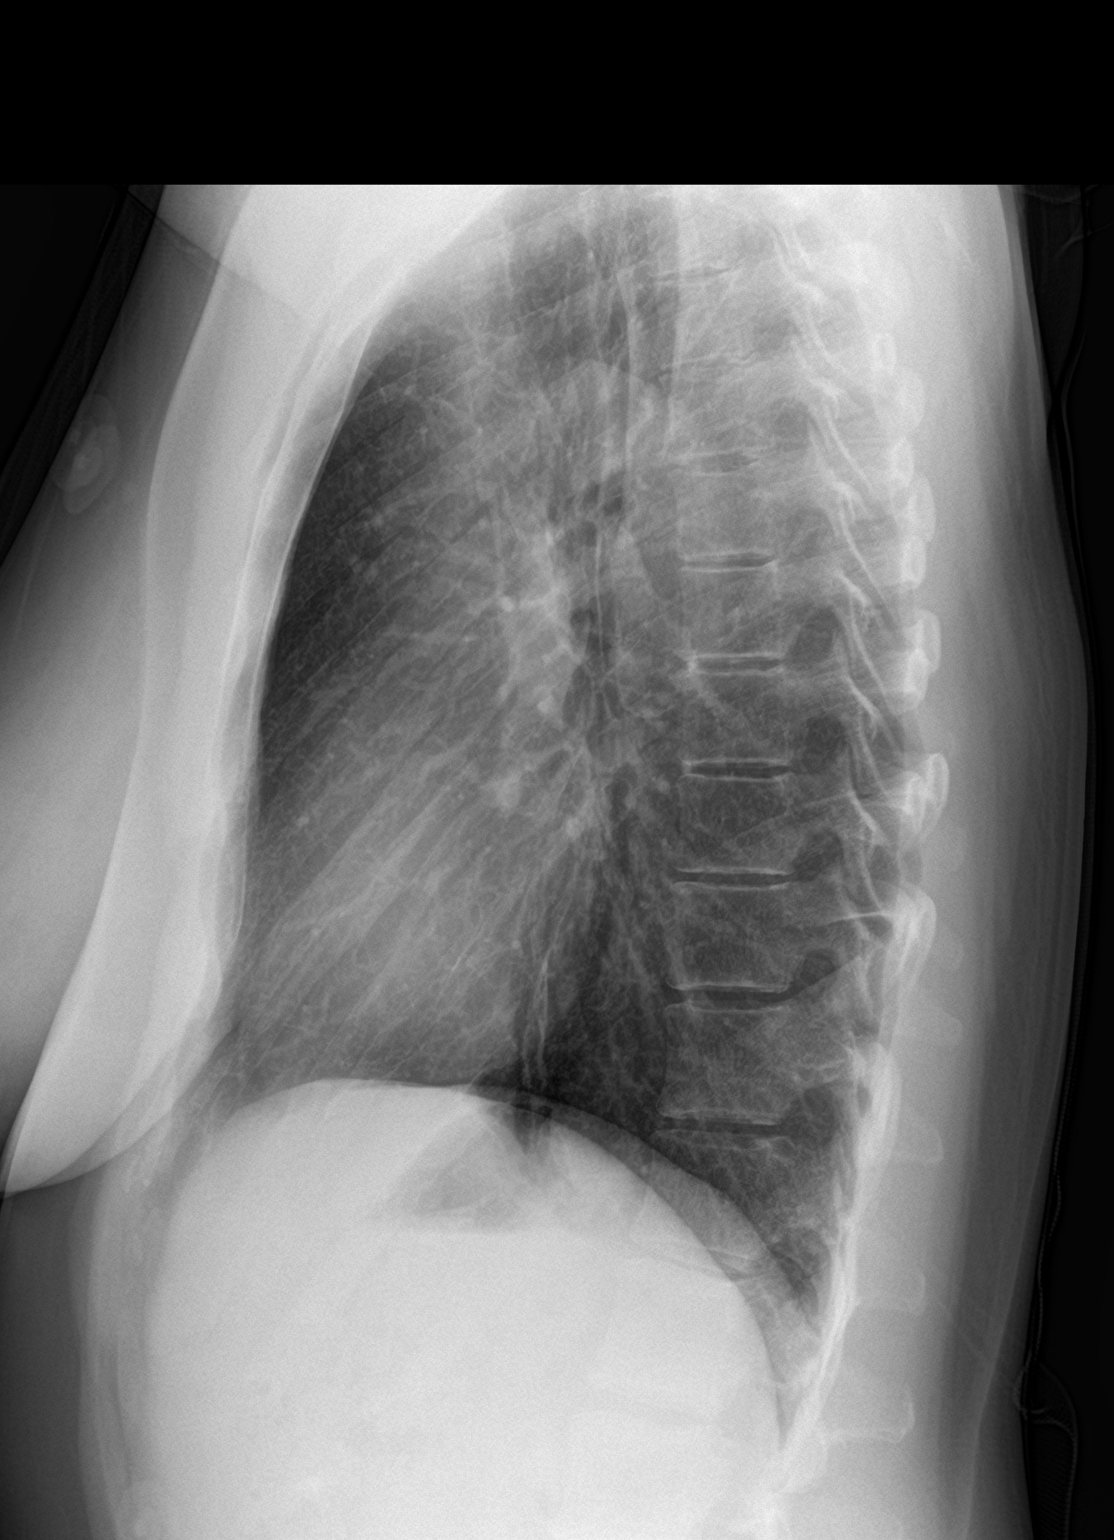

[2 of 2 positions shown; findings below may reference images not displayed]

FINDINGS: The heart size and mediastinal contours are within normal limits.
Both lungs are clear. The visualized skeletal structures are
unremarkable.
IMPRESSION: No active cardiopulmonary disease.

## 2023-07-28 DIAGNOSIS — J358 Other chronic diseases of tonsils and adenoids: Secondary | ICD-10-CM | POA: Diagnosis not present

## 2023-07-28 DIAGNOSIS — Z6823 Body mass index (BMI) 23.0-23.9, adult: Secondary | ICD-10-CM | POA: Diagnosis not present

## 2023-07-28 DIAGNOSIS — J029 Acute pharyngitis, unspecified: Secondary | ICD-10-CM | POA: Diagnosis not present

## 2023-12-16 ENCOUNTER — Ambulatory Visit
Admission: EM | Admit: 2023-12-16 | Discharge: 2023-12-16 | Disposition: A | Discharge status: Home / Self Care | Attending: Emergency Medicine | Admitting: Emergency Medicine

## 2023-12-16 ENCOUNTER — Encounter: Payer: Self-pay | Admitting: Emergency Medicine

## 2023-12-16 ENCOUNTER — Other Ambulatory Visit: Payer: Self-pay

## 2023-12-16 DIAGNOSIS — J069 Acute upper respiratory infection, unspecified: Secondary | ICD-10-CM | POA: Diagnosis not present

## 2023-12-16 LAB — POC COVID19/FLU A&B COMBO
Covid Antigen, POC: NEGATIVE
Influenza A Antigen, POC: NEGATIVE
Influenza B Antigen, POC: NEGATIVE

## 2023-12-16 LAB — POCT RAPID STREP A (OFFICE): Rapid Strep A Screen: NEGATIVE

## 2023-12-16 MED ORDER — AMOXICILLIN-POT CLAVULANATE 875-125 MG PO TABS: 1.0000 | ORAL_TABLET | Freq: Two times a day (BID) | ORAL | 0 refills | Status: DC

## 2023-12-16 NOTE — Discharge Instructions (Signed)
 Your symptoms today are most likely being caused by a virus and should steadily improve in time it can take up to 7 to 10 days before you truly start to see a turnaround however things will get better  He send appearance of your throat as a prophylactic measure you have been started on antibiotic, take Augmentin twice daily for 7 days  COVID flu and strep testing negative, throat swab has been sent to the lab to determine if bacteria will grow, you will be notified if this occurs    You can take Tylenol and/or Ibuprofen as needed for fever reduction and pain relief.   For cough: honey 1/2 to 1 teaspoon (you can dilute the honey in water or another fluid).  You can also use guaifenesin and dextromethorphan for cough. You can use a humidifier for chest congestion and cough.  If you don't have a humidifier, you can sit in the bathroom with the hot shower running.      For sore throat: try warm salt water gargles, cepacol lozenges, throat spray, warm tea or water with lemon/honey, popsicles or ice, or OTC cold relief medicine for throat discomfort.   For congestion: take a daily anti-histamine like Zyrtec, Claritin, and a oral decongestant, such as pseudoephedrine.  You can also use Flonase 1-2 sprays in each nostril daily.   It is important to stay hydrated: drink plenty of fluids (water, gatorade/powerade/pedialyte, juices, or teas) to keep your throat moisturized and help further relieve irritation/discomfort.

## 2023-12-16 NOTE — ED Provider Notes (Signed)
 [facility]    CSN: [id number] Arrival date & time: 12/16/23  1041      History   Chief Complaint Chief Complaint  Patient presents with   URI    HPI Karen Wheeler is a 48 y.o. female.   Patient presents for evaluation of congestion, sore throat and tonsil swelling, left-sided ear pain present for 2 days.  Began to experience subjective fever, chills and bodyaches overnight.  Pain with swallowing progressively worsening but able to tolerate food and liquids.  No known sick contacts.  Has attempted use of Alka-Seltzer, antihistamine, Advil and ibuprofen.  Past Medical History:  Diagnosis Date   Herpes genitalis    Irritable bowel syndrome (IBS)    Tinea versicolor     Patient Active Problem List   Diagnosis Date Noted   LGSIL on Pap smear of cervix 11/05/2018   Genital herpes 10/29/2018    Past Surgical History:  Procedure Laterality Date   WISDOM TOOTH EXTRACTION      OB History     Gravida  1   Para      Term      Preterm      AB  1   Living         SAB      IAB      Ectopic      Multiple      Live Births               Home Medications    Prior to Admission medications   Medication Sig Start Date End Date Taking? Authorizing Provider  amoxicillin-clavulanate (AUGMENTIN) 875-125 MG tablet Take 1 tablet by mouth every 12 (twelve) hours. 12/16/23  Yes Tzvi Economou, Elita Boone, NP  valACYclovir (VALTREX) 1000 MG tablet TAKE ONE HALF (1/2) TABLET BY MOUTH DAILY Patient not taking: Reported on 03/04/2022 02/04/22   Mirna Mires, CNM    Family History Family History  Problem Relation Age of Onset   Hypertension Father    Other Sister        cardiac anomaly, congenital   Stroke Sister 58       had valve replacement   Kidney cancer Maternal Grandmother        ?lung cancer   COPD Maternal Grandmother    Diabetes Other        type 2    Social History Social History   Tobacco Use   Smoking status: Former    Current  packs/day: 0.50    Types: Cigarettes   Smokeless tobacco: Former    Quit date: 06/14/2018  Vaping Use   Vaping status: Never Used  Substance Use Topics   Alcohol use: Yes    Comment: 4/ month   Drug use: Never     Allergies   Patient has no known allergies.   Review of Systems Review of Systems   Physical Exam Triage Vital Signs ED Triage Vitals  Encounter Vitals Group     BP 12/16/23 1056 115/82     Systolic BP Percentile --      Diastolic BP Percentile --      Pulse Rate 12/16/23 1056 100     Resp 12/16/23 1056 16     Temp 12/16/23 1056 (!) 100.5 F (38.1 C)     Temp Source 12/16/23 1056 Oral     SpO2 12/16/23 1056 96 %     Weight --      Height --      Head Circumference --  Peak Flow --      Pain Score 12/16/23 1106 0     Pain Loc --      Pain Education --      Exclude from Growth Chart --    No data found.  Updated Vital Signs BP 115/82 (BP Location: Left Arm)   Pulse 100   Temp (!) 100.5 F (38.1 C) (Oral)   Resp 16   SpO2 96%   Visual Acuity Right Eye Distance:   Left Eye Distance:   Bilateral Distance:    Right Eye Near:   Left Eye Near:    Bilateral Near:     Physical Exam Constitutional:      Appearance: Normal appearance.  HENT:     Head: Normocephalic.     Right Ear: Tympanic membrane, ear canal and external ear normal.     Left Ear: Tympanic membrane, ear canal and external ear normal.     Nose: Congestion present.     Mouth/Throat:     Pharynx: Posterior oropharyngeal erythema present.     Tonsils: Tonsillar exudate present. 2+ on the right. 2+ on the left.  Cardiovascular:     Rate and Rhythm: Normal rate and regular rhythm.     Pulses: Normal pulses.     Heart sounds: Normal heart sounds.  Pulmonary:     Effort: Pulmonary effort is normal.     Breath sounds: Normal breath sounds.  Lymphadenopathy:     Cervical: Cervical adenopathy present.  Neurological:     Mental Status: She is alert and oriented to person, place,  and time.      UC Treatments / Results  Labs (all labs ordered are listed, but only abnormal results are displayed) Labs Reviewed  POCT RAPID STREP A (OFFICE) - Normal  POC COVID19/FLU A&B COMBO - Normal  CULTURE, GROUP A STREP [facility])    EKG   Radiology No results found.  Procedures Procedures (including critical care time)  Medications Ordered in UC Medications - No data to display  Initial Impression / Assessment and Plan / UC Course  I have reviewed the triage vital signs and the nursing notes.  Pertinent labs & imaging results that were available during my care of the patient were reviewed by me and considered in my medical decision making (see chart for details).  Acute URI  Patient is in no signs of distress nor toxic appearing.  Vital signs are stable.  Low suspicion for pneumonia, pneumothorax or bronchitis and therefore will defer imaging.  Flu and strep test negative, sent to culture.  Based on appearance of the oropharynx as there is tonsillar adenopathy, exudate and erythema moving forward with antibiotic use as a prophylactic measure, prescribed Augmentin.May use additional over-the-counter medications as needed for supportive care.  May follow-up with urgent care as needed if symptoms persist or worsen.   Final Clinical Impressions(s) / UC Diagnoses   Final diagnoses:  Acute URI     Discharge Instructions      Your symptoms today are most likely being caused by a virus and should steadily improve in time it can take up to 7 to 10 days before you truly start to see a turnaround however things will get better  He send appearance of your throat as a prophylactic measure you have been started on antibiotic, take Augmentin twice daily for 7 days  COVID flu and strep testing negative, throat swab has been sent to the lab to determine if bacteria will grow, you will  be notified if this occurs    You can take Tylenol and/or Ibuprofen as needed for fever  reduction and pain relief.   For cough: honey 1/2 to 1 teaspoon (you can dilute the honey in water or another fluid).  You can also use guaifenesin and dextromethorphan for cough. You can use a humidifier for chest congestion and cough.  If you don't have a humidifier, you can sit in the bathroom with the hot shower running.      For sore throat: try warm salt water gargles, cepacol lozenges, throat spray, warm tea or water with lemon/honey, popsicles or ice, or OTC cold relief medicine for throat discomfort.   For congestion: take a daily anti-histamine like Zyrtec, Claritin, and a oral decongestant, such as pseudoephedrine.  You can also use Flonase 1-2 sprays in each nostril daily.   It is important to stay hydrated: drink plenty of fluids (water, gatorade/powerade/pedialyte, juices, or teas) to keep your throat moisturized and help further relieve irritation/discomfort.    ED Prescriptions     Medication Sig Dispense Auth. Provider   amoxicillin-clavulanate (AUGMENTIN) 875-125 MG tablet Take 1 tablet by mouth every 12 (twelve) hours. 14 tablet Emylia Latella, Elita Boone, NP      PDMP not reviewed this encounter.   Valinda Hoar, NP 12/16/23 1148

## 2023-12-16 NOTE — ED Triage Notes (Signed)
 Patient presents to [facility] for evaluation of left ear pain, tonsil swelling and nasal drainage starting Monday night.  She began taking alka seltzer and it helped but then this morning around 230am she began to have fever and chills.

## 2023-12-18 LAB — CULTURE, GROUP A STREP (THRC)

## 2024-01-21 ENCOUNTER — Telehealth: Payer: Self-pay

## 2024-01-21 DIAGNOSIS — B009 Herpesviral infection, unspecified: Secondary | ICD-10-CM

## 2024-01-21 MED ORDER — VALACYCLOVIR HCL 1 G PO TABS: ORAL_TABLET | ORAL | 0 refills | Status: DC

## 2024-01-21 NOTE — Telephone Encounter (Signed)
 Karen Wheeler called triage line stating she's out of her Valtrex  and asked if we would call in one refill to hold her over until her annual in June. One refill was sent.

## 2024-03-07 ENCOUNTER — Other Ambulatory Visit (HOSPITAL_COMMUNITY)
Admission: RE | Admit: 2024-03-07 | Discharge: 2024-03-07 | Disposition: A | Discharge status: Home / Self Care | Source: Ambulatory Visit

## 2024-03-07 ENCOUNTER — Ambulatory Visit (INDEPENDENT_AMBULATORY_CARE_PROVIDER_SITE_OTHER)

## 2024-03-07 VITALS — BP 124/92 | HR 69 | Ht 65.0 in | Wt 141.3 lb

## 2024-03-07 DIAGNOSIS — Z01419 Encounter for gynecological examination (general) (routine) without abnormal findings: Secondary | ICD-10-CM

## 2024-03-07 DIAGNOSIS — Z1231 Encounter for screening mammogram for malignant neoplasm of breast: Secondary | ICD-10-CM

## 2024-03-07 DIAGNOSIS — Z124 Encounter for screening for malignant neoplasm of cervix: Secondary | ICD-10-CM | POA: Insufficient documentation

## 2024-03-07 DIAGNOSIS — Z1211 Encounter for screening for malignant neoplasm of colon: Secondary | ICD-10-CM

## 2024-03-07 DIAGNOSIS — B009 Herpesviral infection, unspecified: Secondary | ICD-10-CM

## 2024-03-07 MED ORDER — VALACYCLOVIR HCL 1 G PO TABS: ORAL_TABLET | ORAL | 3 refills | Status: AC

## 2024-03-07 NOTE — Progress Notes (Signed)
 Outpatient Gynecology Note: Annual Visit  Assessment/Plan:   Karen Wheeler is a 48 y.o. female G1P0010 with normal well-woman gynecologic exam.   Well woman exam - Reviewed health maintenance topics as documented below. - Most recent pap smear in 2023, ASCUS-H/HPV+, colpo CIN1, pap smear collected today. - Mammogram UTD, ordered screening today.  - Partner has vasecotmy-Satisfied with current contraception.  - STI screening declined.  - Colonoscopy referral placed. - Valtrex  refilled. - Reviewed recommended preventative health labs including CBC, lipd panel, BMP. Patient is unsure of insurance coverage and would like to wait at this time.      Risk factors identified in Subjective to review: previous smoker, quit in 2019. Orders Placed This Encounter  Procedures   MM 3D SCREENING MAMMOGRAM BILATERAL BREAST    Standing Status:   Future    Expiration Date:   03/07/2025    Reason for Exam (SYMPTOM  OR DIAGNOSIS REQUIRED):   screening    Is the patient pregnant?:   No    Preferred imaging location?:   [facility]   Amb Referral to Colonoscopy    Referral Priority:   Routine    Referral Type:   Consultation    Number of Visits Requested:   1   Current Outpatient Medications  Medication Instructions   cetirizine (ZYRTEC) 10 MG tablet 1 tablet, Daily   valACYclovir  (VALTREX ) 1000 MG tablet TAKE ONE HALF (1/2) TABLET BY MOUTH DAILY    No follow-ups on file.   Subjective:   Karen Wheeler is a 48 y.o. female G1P0010 who presents for annual wellness visit.   Occupation Engineer, maintenance (IT)    Lives alone    CONCERNS? NONE  Well Woman Visit:  GYN HISTORY:  Patient's last menstrual period was 03/02/2024 (approximate).     Menstrual History: OB History     Gravida  1   Para      Term      Preterm      AB  1   Living         SAB      IAB      Ectopic      Multiple      Live Births              Menarche age: 50-13 Patient's last  menstrual period was 03/02/2024 (approximate). Period Duration (Days): 1-3 Period Pattern: (!) Irregular Menstrual Flow: Light, Moderate Menstrual Control: Tampon Menstrual Control Change Freq (Hours): 2-4 Dysmenorrhea: None Intermenstrual bleeding, spotting, or discharge? no Urinary incontinence? no  Sexually active: yes Number of sexual partners: one Gender of sexual Partners: male Dyspareunia? no Last pap:was abnormal: in 2023, ASCUS-H, HPV+  History of abnormal Pap: yes Gardasil series:  no STI history: no STI/HIV testing or immunizations needed? No.  Contraceptive methods: vasectomy  Health Maintenance > Reviewed breast self-awareness, no family history of breast cancer > History of abnormal mammogram: No > Exercise: aerobics and running/ jogging, very active > Dietary Supplements: Vit B complex, D3, K2, B12, tumeric, cinnamon, ginger, black pepper, vanilla > Body mass index is 23.51 kg/m.  > Recent dental visit Yes.   > Seat Belt Use: Yes.   > Safe in current relationship? Yes.   > Concern for alcohol abuse? Only drinks occasionally.    Tobacco or other drug use: denied. Tobacco Use: Medium Risk (03/07/2024)   Patient History    Smoking Tobacco Use: Former    Smokeless Tobacco Use: Former    Passive Exposure:  Not on file      If >40:  Last mammogram:  BIRADS1 Age at menopause: n/a Last colonoscopy:  Last DEXA:  Last lipid screening:   _________________________________________________________  Current Outpatient Medications  Medication Sig Dispense Refill   cetirizine (ZYRTEC) 10 MG tablet Take 1 tablet by mouth daily.     valACYclovir  (VALTREX ) 1000 MG tablet TAKE ONE HALF (1/2) TABLET BY MOUTH DAILY 30 tablet 3   No current facility-administered medications for this visit.   No Known Allergies  Past Medical History:  Diagnosis Date   Herpes genitalis    Irritable bowel syndrome (IBS)    Tinea versicolor    Past Surgical History:  Procedure  Laterality Date   WISDOM TOOTH EXTRACTION     OB History     Gravida  1   Para      Term      Preterm      AB  1   Living         SAB      IAB      Ectopic      Multiple      Live Births             Social History   Tobacco Use   Smoking status: Former    Current packs/day: 0.50    Types: Cigarettes   Smokeless tobacco: Former    Quit date: 06/14/2018  Substance Use Topics   Alcohol use: Yes    Comment: 4/ month   Social History   Substance and Sexual Activity  Sexual Activity Yes   Partners: Male   Birth control/protection: Condom    Immunization History  Administered Date(s) Administered   Tdap 10/29/2018     Review Of Systems  Constitutional: Denied constitutional symptoms, night sweats, recent illness, fatigue, fever, insomnia and weight loss.  Eyes: Denied eye symptoms, eye pain, photophobia, vision change and visual disturbance.  Ears/Nose/Throat/Neck: Denied ear, nose, throat or neck symptoms, hearing loss, nasal discharge, sinus congestion and sore throat.  Cardiovascular: Denied cardiovascular symptoms, arrhythmia, chest pain/pressure, edema, exercise intolerance, orthopnea and palpitations.  Respiratory: Denied pulmonary symptoms, asthma, pleuritic pain, productive sputum, cough, dyspnea and wheezing.  Gastrointestinal: Denied, gastro-esophageal reflux, melena, nausea and vomiting.  Genitourinary: Denied genitourinary symptoms including symptomatic vaginal discharge, pelvic relaxation issues, and urinary complaints.  Musculoskeletal: Denied musculoskeletal symptoms, stiffness, swelling, muscle weakness and myalgia.  Dermatologic: Denied dermatology symptoms, rash and scar.  Neurologic: Denied neurology symptoms, dizziness, headache, neck pain and syncope.  Psychiatric: Denied psychiatric symptoms, anxiety and depression.  Endocrine: Denied endocrine symptoms including hot flashes and night sweats.     Objective:   BP (!) 124/92    Pulse 69   Ht 5' 5 (1.651 m) Comment: patient report  Wt 141 lb 4.8 oz (64.1 kg)   LMP 03/02/2024 (Approximate)   BMI 23.51 kg/m   Constitutional: Well-developed, well-nourished female in no acute distress Neurological: Alert and oriented to person, place, and time Psychiatric: Mood and affect appropriate Skin: No rashes or lesions Neck: Supple without masses. Trachea is midline.Thyroid is normal size without masses Lymphatics: No cervical, axillary, supraclavicular, or inguinal adenopathy noted Respiratory: Clear to auscultation bilaterally. Good air movement with normal work of breathing. Cardiovascular: Regular rate and rhythm. Extremities grossly normal, nontender with no edema; pulses regular Gastrointestinal: Soft, nontender, nondistended. No masses or hernias appreciated. No hepatosplenomegaly. No fluid wave. No rebound or guarding. Breast Exam: normal appearance, no masses or tenderness Genitourinary:  External Genitalia: Normal female genitalia    Vagina: pink, moist, no lesions.    Cervix: No lesions, normal size and consistency; no cervical motion tenderness  Perineum/Anus: No lesions Rectal: deferred   Lauraine Range, CNM  03/07/24 9:54 AM

## 2024-03-07 NOTE — Assessment & Plan Note (Addendum)
-   Reviewed health maintenance topics as documented below. - Most recent pap smear in 2023, ASCUS-H/HPV+, colpo CIN1, pap smear collected today. - Mammogram UTD, ordered screening today.  - Partner has vasecotmy-Satisfied with current contraception.  - STI screening declined.  - Colonoscopy referral placed. - Valtrex  refilled. - Reviewed recommended preventative health labs including CBC, lipd panel, BMP. Patient is unsure of insurance coverage and would like to wait at this time.

## 2024-03-09 LAB — CYTOLOGY - PAP
Comment: NEGATIVE
High risk HPV: NEGATIVE

## 2024-03-11 ENCOUNTER — Ambulatory Visit: Payer: Self-pay
